# Patient Record
Sex: Female | Born: 1990 | Race: Black or African American | Hispanic: No | Marital: Single | Smoking: Never smoker
Health system: Southern US, Community
[De-identification: ages and names within clinical notes are randomized; demographics above are authoritative.]

## PROBLEM LIST (undated history)

## (undated) ENCOUNTER — Inpatient Hospital Stay (HOSPITAL_COMMUNITY): Payer: Self-pay

## (undated) DIAGNOSIS — R011 Cardiac murmur, unspecified: Secondary | ICD-10-CM

## (undated) DIAGNOSIS — D649 Anemia, unspecified: Secondary | ICD-10-CM

## (undated) DIAGNOSIS — F32A Depression, unspecified: Secondary | ICD-10-CM

## (undated) DIAGNOSIS — F329 Major depressive disorder, single episode, unspecified: Secondary | ICD-10-CM

## (undated) DIAGNOSIS — F419 Anxiety disorder, unspecified: Secondary | ICD-10-CM

## (undated) DIAGNOSIS — R0602 Shortness of breath: Secondary | ICD-10-CM

## (undated) HISTORY — PX: OTHER SURGICAL HISTORY: SHX169

---
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
## 2010-04-19 ENCOUNTER — Emergency Department (HOSPITAL_COMMUNITY)
Admission: EM | Admit: 2010-04-19 | Discharge: 2010-04-19 | Payer: Self-pay | Source: Home / Self Care | Admitting: Emergency Medicine

## 2010-07-28 LAB — URINALYSIS, ROUTINE W REFLEX MICROSCOPIC
Ketones, ur: NEGATIVE mg/dL
Leukocytes, UA: NEGATIVE
Nitrite: NEGATIVE
Protein, ur: 30 mg/dL — AB
Urobilinogen, UA: 0.2 mg/dL (ref 0.0–1.0)

## 2010-07-28 LAB — URINE MICROSCOPIC-ADD ON

## 2010-08-26 LAB — POCT HEMOGLOBIN-HEMACUE: Hemoglobin: 12.5 g/dL (ref 12.0–16.0)

## 2010-09-29 NOTE — Op Note (Signed)
NAME:  Miranda Esparza, Miranda Esparza NO.:  [id number]   MEDICAL RECORD NO.:  [id number]          PATIENT TYPE:  AMB   LOCATION:  DSC                          FACILITY:  [facility]   PHYSICIAN:  Eulas Post, MD    DATE OF BIRTH:  [birth year]   DATE OF PROCEDURE:  09/06/2008  DATE OF DISCHARGE:                               OPERATIVE REPORT   PREOPERATIVE DIAGNOSIS:  Right proximal phalanx fracture of the thumb,  intraarticular.   POSTOPERATIVE DIAGNOSIS:  Right proximal phalanx fracture of the thumb,  intraarticular.   OPERATIVE PROCEDURE:  Open reduction and internal fixation of the right  proximal phalanx articular fracture.   OPERATIVE IMPLANTS:  Synthes 1.3-mm titanium screw x1.   PREOPERATIVE INDICATIONS:  Ms. Miranda Esparza is a 20 year old young  girl who had a right thumb interphalangeal joint fracture dislocation.  She underwent emergent closed reduction last Friday.  She elected to  undergo open reduction and internal fixation of the proximal phalanx  fracture.  This is a planned surgery from the original closed reduction.  The risks, benefits, and alternatives were discussed with her and her  family before the procedure including but not limited to the risks of  infection, bleeding, nerve injury, hardware prominence, hardware  failure, the need for hardware removal, malunion, nonunion, stiffness,  posttraumatic arthritis, cardiopulmonary complications, among others,  and they were willing to proceed.   OPERATIVE PROCEDURE:  The patient was brought to the operating room,  placed in the supine position.  Intravenous Ancef 1 g was given.  General anesthesia was administered.  The right upper extremity was  prepped and draped in the usual sterile fashion.  The arm was elevated  and exsanguinated and the tourniquet was inflated.  Tourniquet pressure  was 250 mmHg for a total of approximately 50 minutes.  An incision was  made curvilinear over the ulnar side of the  interphalangeal joint.  The  fracture was exposed.  Care was taken to protect the collateral  ligaments as well as the palmar digital nerves.  The extensor  retinaculum was incised sharply.  After the fracture was exposed, we  removed the hematoma and reduced the fracture anatomically and held it  provisionally with a clamp.  We then drilled across the condyle and  placed a 1.3-mm screw.  Anatomic alignment was achieved.  Confirmation  was made with C-arm.  Direct visualization of the articular surface  confirmed that anatomic restoration of the joint surface is achieved.  We then irrigated the wounds copiously.  I gave consideration to placing  a second screw, but the first screw did have excellent purchase.  I was  concerned that the condyle may splinter with additional attempts at  fixation and clinically I did not think this was necessary.  I ranged  the thumb under live fluoroscopy and the fracture segments were  extremely stable.  Therefore, we irrigated the wounds copiously.  Rotational stability was restored with the screw, as well as the  fracture in a digitation.  We then closed the extensor retinaculum with  4-0  Vicryl and then closed the skin with 4-0  nylon.  A digital block was  applied.  A thumb spica splint was then applied.  The tourniquet was  released.  The patient tolerated the procedure well.  There were no  complications.  I will plan to initiate somewhat early motion, probably  at approximately 2 weeks.      Eulas Post, MD  Electronically Signed     JPL/MEDQ  D:  09/06/2008  T:  09/06/2008  Job:  [id number]

## 2010-11-29 ENCOUNTER — Emergency Department (HOSPITAL_COMMUNITY)
Admission: EM | Admit: 2010-11-29 | Discharge: 2010-11-29 | Disposition: A | Discharge status: Home / Self Care | Payer: Self-pay | Attending: Emergency Medicine | Admitting: Emergency Medicine

## 2010-11-29 ENCOUNTER — Emergency Department (HOSPITAL_COMMUNITY): Admission: EM | Admit: 2010-11-29 | Payer: Self-pay | Source: Home / Self Care

## 2010-11-29 DIAGNOSIS — M545 Low back pain, unspecified: Secondary | ICD-10-CM | POA: Insufficient documentation

## 2010-11-29 DIAGNOSIS — M549 Dorsalgia, unspecified: Secondary | ICD-10-CM | POA: Insufficient documentation

## 2011-02-05 ENCOUNTER — Emergency Department (HOSPITAL_COMMUNITY): Payer: Self-pay

## 2011-02-05 ENCOUNTER — Inpatient Hospital Stay (HOSPITAL_COMMUNITY)
Admission: EM | Admit: 2011-02-05 | Discharge: 2011-02-07 | DRG: 312 | Disposition: A | Discharge status: Home / Self Care | Payer: Self-pay | Attending: Family Medicine | Admitting: Family Medicine

## 2011-02-05 DIAGNOSIS — R0602 Shortness of breath: Secondary | ICD-10-CM | POA: Diagnosis present

## 2011-02-05 DIAGNOSIS — R079 Chest pain, unspecified: Secondary | ICD-10-CM | POA: Diagnosis present

## 2011-02-05 DIAGNOSIS — E876 Hypokalemia: Secondary | ICD-10-CM | POA: Diagnosis present

## 2011-02-05 DIAGNOSIS — F411 Generalized anxiety disorder: Secondary | ICD-10-CM | POA: Diagnosis present

## 2011-02-05 DIAGNOSIS — I498 Other specified cardiac arrhythmias: Secondary | ICD-10-CM | POA: Diagnosis present

## 2011-02-05 DIAGNOSIS — B9789 Other viral agents as the cause of diseases classified elsewhere: Secondary | ICD-10-CM | POA: Diagnosis present

## 2011-02-05 DIAGNOSIS — R011 Cardiac murmur, unspecified: Secondary | ICD-10-CM | POA: Diagnosis present

## 2011-02-05 DIAGNOSIS — I951 Orthostatic hypotension: Principal | ICD-10-CM | POA: Diagnosis present

## 2011-02-05 LAB — DIFFERENTIAL
Basophils Relative: 0 % (ref 0–1)
Eosinophils Absolute: 0.1 10*3/uL (ref 0.0–0.7)
Eosinophils Relative: 1 % (ref 0–5)
Monocytes Absolute: 0.3 10*3/uL (ref 0.1–1.0)
Monocytes Relative: 4 % (ref 3–12)
Neutrophils Relative %: 74 % (ref 43–77)

## 2011-02-05 LAB — BLOOD GAS, ARTERIAL
Acid-base deficit: 0.3 mmol/L (ref 0.0–2.0)
Acid-base deficit: 0.8 mmol/L (ref 0.0–2.0)
Drawn by: 295031
Drawn by: 340271
FIO2: 0.21 %
O2 Saturation: 97.6 %
TCO2: 20.9 mmol/L (ref 0–100)
pCO2 arterial: 33.2 mmHg — ABNORMAL LOW (ref 35.0–45.0)

## 2011-02-05 LAB — CBC
MCH: 25.9 pg — ABNORMAL LOW (ref 26.0–34.0)
MCHC: 32.5 g/dL (ref 30.0–36.0)
Platelets: 209 10*3/uL (ref 150–400)
RBC: 4.9 MIL/uL (ref 3.87–5.11)
RDW: 13.9 % (ref 11.5–15.5)

## 2011-02-05 LAB — URINALYSIS, ROUTINE W REFLEX MICROSCOPIC
Bilirubin Urine: NEGATIVE
Ketones, ur: NEGATIVE mg/dL
Leukocytes, UA: NEGATIVE
Nitrite: NEGATIVE
Protein, ur: NEGATIVE mg/dL

## 2011-02-05 LAB — BASIC METABOLIC PANEL
Calcium: 9.7 mg/dL (ref 8.4–10.5)
GFR calc Af Amer: >60 mL/min (ref >60)
GFR calc non Af Amer: >60 mL/min (ref >60)
Sodium: 137 mEq/L (ref 135–145)

## 2011-02-05 LAB — POCT PREGNANCY, URINE: Preg Test, Ur: NEGATIVE

## 2011-02-05 MED ORDER — IOHEXOL 300 MG/ML  SOLN
100.0000 mL | Freq: Once | INTRAMUSCULAR | Status: AC | PRN
  Administered 2011-02-05: 60 mL via INTRAVENOUS

## 2011-02-06 LAB — BASIC METABOLIC PANEL
BUN: 4 mg/dL — ABNORMAL LOW (ref 6–23)
CO2: 26 mEq/L (ref 19–32)
Calcium: 8.5 mg/dL (ref 8.4–10.5)
GFR calc non Af Amer: >60 mL/min (ref >60)
Glucose, Bld: 86 mg/dL (ref 70–99)
Potassium: 4.4 mEq/L (ref 3.5–5.1)

## 2011-02-06 LAB — RAPID URINE DRUG SCREEN, HOSP PERFORMED
Barbiturates: NOT DETECTED
Benzodiazepines: NOT DETECTED
Cocaine: NOT DETECTED
Tetrahydrocannabinol: POSITIVE — AB

## 2011-02-06 LAB — TSH: TSH: 2.166 u[IU]/mL (ref 0.350–4.500)

## 2011-02-07 LAB — URINE CULTURE: Culture  Setup Time: 201209220054

## 2011-02-09 NOTE — Discharge Summary (Signed)
  NAMEMarland Kitchen  Miranda, Esparza NO.:  [id number]  MEDICAL RECORD NO.:  [id number]  LOCATION:  1405                         FACILITY:  [facility]  PHYSICIAN:  Brendia Sacks, MD    DATE OF BIRTH:  11-28-1990  DATE OF ADMISSION:  02/05/2011 DATE OF DISCHARGE:  02/07/2011                              DISCHARGE SUMMARY   CONDITION ON DISCHARGE:  Improved.  DISPOSITION:  Home.  DISCHARGE DIAGNOSES: 1. Syncope, likely vasovagal. 2. Mild orthostasis, resolving. 3. Probable viral illness. 4. Hypokalemia, resolved. 5. Murmur, benign.  HISTORY OF PRESENT ILLNESS:  This is a 20 year old woman who presented to the emergency room after having had syncope.  She had been developing a viral illness.  She had 1 previous episode of syncope in the last few months and was attributed to anxiety.  She was found to have a murmur on examination and so was admitted for further observation and 2-D echocardiogram.  HOSPITAL COURSE:  Miranda Esparza was admitted to medical floor, monitored on telemetry.  She had no arrhythmias, no syncope.  Her 2-D echocardiogram is read and shows no abnormalities.  She feels much better now and her orthostasis has improved.  She feels ready to go home.  CONSULTATIONS:  None.  PROCEDURES:  None.  IMAGING: 1. Soft tissue neck secondary to sore throat was normal. 2. Chest x-ray was normal. 3. CT angiogram of chest, no evidence of pulmonary embolism.  MICROBIOLOGY: 1. Urine culture, September 21st:  Mixed bacterial morphotypes     present, non-predominant. 2. 2-D echocardiogram, September 22nd:  Ejection fraction of the left     ventricle 60% to 65%.  Normal wall motion.  No regional wall motion     abnormalities.  PERTINENT LABORATORY STUDIES: 1. Urine pregnancy was negative. 2. Repeat ABG was unremarkable.  First was artifactual. 3. Urine drug screen positive for THC. 4. TSH within normal limits. 5. Recommend cessation of illegal drugs.  DISCHARGE  PHYSICAL EXAMINATION:  GENERAL:  The patient is feeling well. No complaints. CARDIOVASCULAR:  Regular rate and rhythm.  No rub or gallop.  A soft 1/6 murmur, heard only when lying down. RESPIRATORY:  Clear to auscultation bilaterally.  No wheezes, rales, or rhonchi.  Normal respiratory effort.  DISCHARGE INSTRUCTIONS:  ACTIVITIES:  The patient should take time making transitions from sitting to standing, but otherwise activities unrestricted.  DIET:  Unrestricted.  FOLLOWUP:  With [facility] in 2 weeks.  DISCHARGE MEDICATIONS:  None.  TIME COORDINATING DISCHARGE:  15 minutes.     Brendia Sacks, MD     DG/MEDQ  D:  02/07/2011  T:  02/08/2011  Job:  [id number]  Electronically Signed by Brendia Sacks  on 02/09/2011 10:28:23 PM

## 2011-03-02 NOTE — H&P (Signed)
NAME:  Miranda Esparza, Miranda Esparza NO.:  [id number]  MEDICAL RECORD NO.:  [id number]  LOCATION:  WLED                         FACILITY:  [facility]  PHYSICIAN:  Houston Siren, MD           DATE OF BIRTH:  1991-04-03  DATE OF ADMISSION:  02/05/2011 DATE OF DISCHARGE:                             HISTORY & PHYSICAL   PRIMARY CARE PHYSICIAN:  [facility].  ADVANCE DIRECTIVE:  Full code.  REASON FOR ADMISSION:  Syncope, hypokalemia, shortness of breath, and heart murmur.  HISTORY OF PRESENT ILLNESS:  This is a 20 year old female with history of anxiety, otherwise healthy, who had not felt well for the past 2 days and had a syncopal episode at work today.  She stated she felt lightheaded and was having substernal chest pain and shortness of breath.  She denied any leg pain.  She had no fever or chills.  Denied any drug use.  She had no black stool or bloody stools.  Evaluation in the emergency room included a negative urinary pregnancy test.  The ABG showed pH of 7.45, pCO2 of 33, PAO2 of 50, and reportedly she did fall when walking.  EKG showed sinus tachycardia at 120 without any acute ST- T changes.  She had a CT pulmonary angiogram which was negative for pulmonary embolism.  Her chest x-ray is clear.  Her urinalysis is negative.  She also has soft tissue of neck plain film and that was negative as well.  I repeat an arterial blood gas and that was normal and suspect the previous gas was a venous gas.  PAST MEDICAL HISTORY:  Benign.  SOCIAL HISTORY:  She does smoke and drink occasionally.  She is employed.  She denied having any other medical problems.  FAMILY HISTORY:  Not significant.  ALLERGIES:  No known drug allergies.  MEDICATIONS:  On no chronic meds.  PHYSICAL EXAMINATION:  VITAL SIGNS:  Shows blood pressure is 101/50, heart rate of 120, respiratory rate of 15, temperature 99.4. GENERAL:  She is alert and oriented.  She is in no apparent distress. HEENT:   Her pupils are small, but reactive.  Throat is clear.  Sclerae nonicteric. NECK:  Supple.  No carotid bruits.  No stridor. LUNGS:  Clear.  No wheezes, rales, or any evidence of consolidation. HEART EXAM:  Revealed as tachycardic.  There is a murmur, very faint, only 1 to 2 over 6; however, it is louder with inspiration and Valsalva. ABDOMINAL EXAM:  Soft, nondistended, nontender. EXTREMITIES:  Show no edema.  No calf tenderness. SKIN:  Warm and dry. NEUROLOGIC:  She appears to be tired, but otherwise unremarkable.  LABORATORY STUDIES:  As above.  IMPRESSION:  This is a 20 year old with syncope, chest pain, hypokalemia, and an abnormal heart murmur that is soft.  I suspect that she just had a vasovagal syncope and had a panic attack with hypoventilation.  However, given that she has multiple conditions and symptoms with an abnormal heart murmur, we will admit her for observation.  She will need an echo.  I will also replete the potassium and check a magnesium.  She is otherwise very stable, and I had thought about discharging  her home, but had decided against it.  We will admit her to [facility].     Houston Siren, MD     PL/MEDQ  D:  02/05/2011  T:  02/05/2011  Job:  [id number]  Electronically Signed by Houston Siren  on 03/02/2011 03:52:47 AM

## 2012-11-26 ENCOUNTER — Inpatient Hospital Stay (HOSPITAL_COMMUNITY): Payer: Self-pay

## 2012-11-26 ENCOUNTER — Encounter (HOSPITAL_COMMUNITY): Payer: Self-pay | Admitting: Family

## 2012-11-26 ENCOUNTER — Inpatient Hospital Stay (HOSPITAL_COMMUNITY)
Admission: AD | Admit: 2012-11-26 | Discharge: 2012-11-26 | Disposition: A | Discharge status: Home / Self Care | Payer: Self-pay | Source: Ambulatory Visit | Attending: Obstetrics & Gynecology | Admitting: Obstetrics & Gynecology

## 2012-11-26 DIAGNOSIS — R109 Unspecified abdominal pain: Secondary | ICD-10-CM | POA: Insufficient documentation

## 2012-11-26 DIAGNOSIS — O26899 Other specified pregnancy related conditions, unspecified trimester: Secondary | ICD-10-CM

## 2012-11-26 DIAGNOSIS — Z3201 Encounter for pregnancy test, result positive: Secondary | ICD-10-CM

## 2012-11-26 DIAGNOSIS — O99891 Other specified diseases and conditions complicating pregnancy: Secondary | ICD-10-CM | POA: Insufficient documentation

## 2012-11-26 HISTORY — DX: Major depressive disorder, single episode, unspecified: F32.9

## 2012-11-26 HISTORY — DX: Anxiety disorder, unspecified: F41.9

## 2012-11-26 HISTORY — DX: Shortness of breath: R06.02

## 2012-11-26 HISTORY — DX: Depression, unspecified: F32.A

## 2012-11-26 LAB — CBC
MCH: 26.6 pg (ref 26.0–34.0)
MCV: 78.2 fL (ref 78.0–100.0)
Platelets: 210 10*3/uL (ref 150–400)
RBC: 4.59 MIL/uL (ref 3.87–5.11)

## 2012-11-26 LAB — URINALYSIS, ROUTINE W REFLEX MICROSCOPIC
Glucose, UA: NEGATIVE mg/dL
Hgb urine dipstick: NEGATIVE
Specific Gravity, Urine: 1.02 (ref 1.005–1.030)
Urobilinogen, UA: 0.2 mg/dL (ref 0.0–1.0)
pH: 6.5 (ref 5.0–8.0)

## 2012-11-26 LAB — WET PREP, GENITAL: Yeast Wet Prep HPF POC: NONE SEEN

## 2012-11-26 LAB — POCT PREGNANCY, URINE: Preg Test, Ur: POSITIVE — AB

## 2012-11-26 MED ORDER — PROMETHAZINE HCL 25 MG PO TABS: 25.0000 mg | ORAL_TABLET | Freq: Four times a day (QID) | ORAL | Status: DC | PRN

## 2012-11-26 MED ORDER — ONDANSETRON HCL 4 MG PO TABS: 4.0000 mg | ORAL_TABLET | Freq: Four times a day (QID) | ORAL | Status: DC

## 2012-11-26 NOTE — MAU Note (Signed)
Pt reports lower abd cramping x 2 days. LMP 10/29/2012, positive home preg test yesterday.

## 2012-11-26 NOTE — MAU Provider Note (Signed)
History     CSN: [id number]  Arrival date and time: 11/26/12 6440   First Provider Initiated Contact with Patient 11/26/12 2020      Chief Complaint  Patient presents with  . Possible Pregnancy  . Abdominal Pain   HPI Ms. Miranda Esparza is a 22 y.o. G1P0 at [redacted]w[redacted]d who presents to MAU today with complaint of lower abdominal cramping off and on x 2 days. The patient states +HPT yesterday. She has also had occasional N/V. She has a small amount of thin, white discharge without odor. She has no pain now, but states that pain was 7/10 upon arrival in MAU. The patient denies fever, vaginal bleeding, dizziness or UTI symptoms.    OB History   Grav Para Term Preterm Abortions TAB SAB Ect Mult Living   1               Past Medical History  Diagnosis Date  . Shortness of breath     since having bronchitis May 2014  . Anxiety   . Depression     Past Surgical History  Procedure Laterality Date  . Thumb surgery  2010    History reviewed. No pertinent family history.  History  Substance Use Topics  . Smoking status: Never Smoker   . Smokeless tobacco: Never Used  . Alcohol Use: Yes    Allergies: No Known Allergies  No prescriptions prior to admission    Review of Systems  Constitutional: Negative for fever and malaise/fatigue.  Gastrointestinal: Positive for nausea, vomiting and abdominal pain. Negative for diarrhea and constipation.  Genitourinary: Negative for dysuria, urgency and frequency.       Neg - vaginal bleeding + vaginal discharge  Neurological: Negative for dizziness, loss of consciousness and weakness.   Physical Exam   Blood pressure 117/70, pulse 79, temperature 98.1 F (36.7 C), temperature source Oral, resp. rate 16, height 5\' 1"  (1.549 m), weight 100 lb (45.36 kg), last menstrual period 10/29/2012, SpO2 100.00%.  Physical Exam  Constitutional: She is oriented to person, place, and time. She appears well-developed and well-nourished. No distress.   HENT:  Head: Normocephalic and atraumatic.  Cardiovascular: Normal rate, regular rhythm and normal heart sounds.   Respiratory: Effort normal and breath sounds normal. No respiratory distress.  GI: Soft. Bowel sounds are normal. She exhibits no distension and no mass. There is no tenderness. There is no rebound and no guarding.  Genitourinary: Uterus is not enlarged and not tender. Cervix exhibits no motion tenderness, no discharge and no friability. Right adnexum displays no mass and no tenderness. Left adnexum displays no mass and no tenderness. No bleeding around the vagina. Vaginal discharge (scant thin, white discharge noted) found.  Neurological: She is alert and oriented to person, place, and time.  Skin: Skin is warm and dry. No erythema.  Psychiatric: She has a normal mood and affect.   Results for orders placed during the hospital encounter of 11/26/12 (from the past 24 hour(s))  URINALYSIS, ROUTINE W REFLEX MICROSCOPIC     Status: None   Collection Time    11/26/12  7:45 PM      Result Value Range   Color, Urine YELLOW  YELLOW   APPearance CLEAR  CLEAR   Specific Gravity, Urine 1.020  1.005 - 1.030   pH 6.5  5.0 - 8.0   Glucose, UA NEGATIVE  NEGATIVE mg/dL   Hgb urine dipstick NEGATIVE  NEGATIVE   Bilirubin Urine NEGATIVE  NEGATIVE   Ketones, ur  NEGATIVE  NEGATIVE mg/dL   Protein, ur NEGATIVE  NEGATIVE mg/dL   Urobilinogen, UA 0.2  0.0 - 1.0 mg/dL   Nitrite NEGATIVE  NEGATIVE   Leukocytes, UA NEGATIVE  NEGATIVE  POCT PREGNANCY, URINE     Status: Abnormal   Collection Time    11/26/12  7:57 PM      Result Value Range   Preg Test, Ur POSITIVE (*) NEGATIVE  WET PREP, GENITAL     Status: Abnormal   Collection Time    11/26/12  8:30 PM      Result Value Range   Yeast Wet Prep HPF POC NONE SEEN  NONE SEEN   Trich, Wet Prep NONE SEEN  NONE SEEN   Clue Cells Wet Prep HPF POC NONE SEEN  NONE SEEN   WBC, Wet Prep HPF POC FEW (*) NONE SEEN  CBC     Status: Abnormal    Collection Time    11/26/12  8:40 PM      Result Value Range   WBC 3.7 (*) 4.0 - 10.5 K/uL   RBC 4.59  3.87 - 5.11 MIL/uL   Hemoglobin 12.2  12.0 - 15.0 g/dL   HCT 16.1 (*) 09.6 - 04.5 %   MCV 78.2  78.0 - 100.0 fL   MCH 26.6  26.0 - 34.0 pg   MCHC 34.0  30.0 - 36.0 g/dL   RDW 40.9  81.1 - 91.4 %   Platelets 210  150 - 400 K/uL  ABO/RH     Status: None   Collection Time    11/26/12  8:40 PM      Result Value Range   ABO/RH(D) B POS    HCG, QUANTITATIVE, PREGNANCY     Status: Abnormal   Collection Time    11/26/12  8:40 PM      Result Value Range   hCG, Beta Chain, Quant, S 33 (*) <5 mIU/mL   US Ob Comp Less 14 Wks  11/26/2012   *RADIOLOGY REPORT*  Clinical Data: Lower abdominal and pelvic pain.  OBSTETRIC <14 WK [place] AND TRANSVAGINAL OB US  Technique:  Both transabdominal and transvaginal ultrasound examinations were performed for complete evaluation of the gestation as well as the maternal uterus, adnexal regions, and pelvic cul-de-sac.  Transvaginal technique was performed to assess early pregnancy.  Comparison:  None.  Intrauterine gestational sac:  None seen. Yolk sac: N/A Embryo: N/A  Maternal uterus/adnexae: The uterus is unremarkable in appearance; there is suggestion of an arcuate uterus.  No subchorionic hemorrhage is noted.  The ovaries are within normal limits.  The right ovary measures 3.4 x 1.5 x 1.9 cm, while the left ovary measures 3.1 x 1.4 x 1.8 cm. No suspicious adnexal masses are seen; there is no evidence for ovarian torsion.  No free fluid is seen within the pelvic cul-de-sac.  IMPRESSION: No intrauterine gestational sac seen at this time; no evidence for ectopic pregnancy.  No free fluid seen in the pelvic cul-de-sac. It may be too early to characterize an intrauterine pregnancy. Would correlate with the patient's pending quantitative beta HCG level.   Original Report Authenticated By: Tonia Ghent, M.D.   US Ob Transvaginal  11/26/2012   *RADIOLOGY REPORT*  Clinical  Data: Lower abdominal and pelvic pain.  OBSTETRIC <14 WK [place] AND TRANSVAGINAL OB US  Technique:  Both transabdominal and transvaginal ultrasound examinations were performed for complete evaluation of the gestation as well as the maternal uterus, adnexal regions, and pelvic cul-de-sac.  Transvaginal  technique was performed to assess early pregnancy.  Comparison:  None.  Intrauterine gestational sac:  None seen. Yolk sac: N/A Embryo: N/A  Maternal uterus/adnexae: The uterus is unremarkable in appearance; there is suggestion of an arcuate uterus.  No subchorionic hemorrhage is noted.  The ovaries are within normal limits.  The right ovary measures 3.4 x 1.5 x 1.9 cm, while the left ovary measures 3.1 x 1.4 x 1.8 cm. No suspicious adnexal masses are seen; there is no evidence for ovarian torsion.  No free fluid is seen within the pelvic cul-de-sac.  IMPRESSION: No intrauterine gestational sac seen at this time; no evidence for ectopic pregnancy.  No free fluid seen in the pelvic cul-de-sac. It may be too early to characterize an intrauterine pregnancy. Would correlate with the patient's pending quantitative beta HCG level.   Original Report Authenticated By: Tonia Ghent, M.D.    MAU Course  Procedures None  MDM +UPT UA, Wet prep, GC/Chlamydia, CBC, ABO/Rh, quant hCG and [place] today No IUGS visualized on [place] today - not expected with GA and quant hCG of 33. Will have patient follow-up for serial hCGs  Assessment and Plan  A: Positive pregnancy test Abdominal pain in pregnancy  P: Discharge home Rx for Phenergan and Zofran sent to patient's pharmacy Pregnancy confirmation letter given First trimester warning signs reviewed Patient to return to MAU in 48hours for follow up quant hCG Patient may return to MAU sooner as needed or if her condition were to change or worsen  Freddi Starr, PA-C  11/26/2012, 10:58 PM

## 2012-11-26 NOTE — MAU Note (Signed)
Patient presents to MAU with c/o lower abdominal pain x 2 days; reports +HPT yesterday. Denies vaginal bleeding.

## 2012-11-28 ENCOUNTER — Encounter (HOSPITAL_COMMUNITY): Payer: Self-pay

## 2012-11-28 ENCOUNTER — Inpatient Hospital Stay (HOSPITAL_COMMUNITY)
Admission: AD | Admit: 2012-11-28 | Discharge: 2012-11-28 | Disposition: A | Discharge status: Home / Self Care | Payer: Self-pay | Source: Ambulatory Visit | Attending: Family Medicine | Admitting: Family Medicine

## 2012-11-28 DIAGNOSIS — R109 Unspecified abdominal pain: Secondary | ICD-10-CM | POA: Insufficient documentation

## 2012-11-28 DIAGNOSIS — O4690 Antepartum hemorrhage, unspecified, unspecified trimester: Secondary | ICD-10-CM

## 2012-11-28 DIAGNOSIS — O209 Hemorrhage in early pregnancy, unspecified: Secondary | ICD-10-CM | POA: Insufficient documentation

## 2012-11-28 MED ORDER — HYDROMORPHONE HCL PF 1 MG/ML IJ SOLN
1.0000 mg | INTRAMUSCULAR | Status: AC
  Administered 2012-11-28: 1 mg via INTRAMUSCULAR
  Filled 2012-11-28: qty 1

## 2012-11-28 NOTE — MAU Note (Signed)
Patient to MAU for follow up BHCG. Patient states she started bleeding like a period last night and having abdominal cramping.

## 2012-11-28 NOTE — MAU Provider Note (Signed)
  History     CSN: [id number]  Arrival date and time: 11/28/12 1215   First Provider Initiated Contact with Patient 11/28/12 1345      Chief Complaint  Patient presents with  . Follow-up   HPI Pt is G1P0 [redacted]w[redacted]d pregnant and presents with abdominal cramping which has come and gone but is worse today.  Pt was seen 2 days ago with HCG of 43 and normal ultrasound with normal uterus and ovaries.  Pt is here for f/u  Peace Thompson Caul, RN Registered Nurse Signed  MAU Note Service date: 11/28/2012 12:37 PM   Patient is in for a f/u bhcg but is complaining of painful abdominal cramping. Patient is asking for pain medicine.      Past Medical History  Diagnosis Date  . Shortness of breath     since having bronchitis May 2014  . Anxiety   . Depression     Past Surgical History  Procedure Laterality Date  . Thumb surgery  2010    History reviewed. No pertinent family history.  History  Substance Use Topics  . Smoking status: Never Smoker   . Smokeless tobacco: Never Used  . Alcohol Use: Yes    Allergies: No Known Allergies  No prescriptions prior to admission    Review of Systems  Gastrointestinal: Positive for nausea and abdominal pain. Negative for diarrhea and constipation.  Genitourinary: Negative for dysuria and urgency.   Physical Exam   Blood pressure 125/76, pulse 81, temperature 98.5 F (36.9 C), temperature source Oral, resp. rate 18, last menstrual period 10/29/2012, SpO2 100.00%.  Physical Exam  Vitals reviewed. Constitutional: She is oriented to person, place, and time. She appears well-developed and well-nourished.  HENT:  Head: Normocephalic.  Eyes: Pupils are equal, round, and reactive to light.  Neck: Normal range of motion. Neck supple.  Cardiovascular: Normal rate.   Respiratory: Effort normal.  GI: Soft. She exhibits no distension. There is no tenderness. There is no rebound and no guarding.  Genitourinary:  Small amount of dark red blood  from os; cervix closed; bimanual uterus NSSC mild right adnexal tenderness with palpation, no rebound; left adnexa without palpable enlargement or tenderness  Musculoskeletal: Normal range of motion.  Neurological: She is alert and oriented to person, place, and time.  Skin: Skin is warm and dry.    MAU Course  Procedures Dilaudid 1 mg IM given for pain with relief  Results for orders placed during the hospital encounter of 11/28/12 (from the past 24 hour(s))  HCG, QUANTITATIVE, PREGNANCY     Status: Abnormal   Collection Time    11/28/12 12:20 PM      Result Value Range   hCG, Beta Chain, Quant, S 43 (*) <5 mIU/mL     Assessment and Plan  Bleeding in early pregnancy Inappropriate rise in HCG from 33 to 43 in 48 hrs F/u in 48 hours for repeat HCG  Ectopic precautions discussed with pt and partner- pt to go to nearest hospital if increase in pain - ectopic risk discussed and could not be ruled out at this time  [facility] 11/28/2012, 5:17 PM

## 2012-11-28 NOTE — MAU Provider Note (Signed)
Chart reviewed and agree with management and plan.  

## 2012-11-28 NOTE — MAU Note (Signed)
Patient is in for a f/u bhcg but is complaining of painful abdominal cramping. Patient is asking for pain medicine.

## 2012-12-01 ENCOUNTER — Inpatient Hospital Stay (HOSPITAL_COMMUNITY)
Admission: AD | Admit: 2012-12-01 | Discharge: 2012-12-01 | Disposition: A | Discharge status: Home / Self Care | Payer: Self-pay | Source: Ambulatory Visit | Attending: Obstetrics & Gynecology | Admitting: Obstetrics & Gynecology

## 2012-12-01 DIAGNOSIS — O039 Complete or unspecified spontaneous abortion without complication: Secondary | ICD-10-CM | POA: Insufficient documentation

## 2012-12-01 LAB — HCG, QUANTITATIVE, PREGNANCY: hCG, Beta Chain, Quant, S: 6 m[IU]/mL — ABNORMAL HIGH (ref <5)

## 2012-12-01 NOTE — MAU Note (Signed)
Was here Tues and has returned for repeat BHCG. States " I'm still on my period but I was bleeding when I was here Tuesday". Only change since Tues is that cramping has stopped.

## 2012-12-01 NOTE — MAU Provider Note (Signed)
  History     CSN: [id number]  Arrival date and time: 12/01/12 1847   None     No chief complaint on file.  HPI Miranda Esparza is 22 y.o. G1P0 [redacted]w[redacted]d weeks presenting for repeat BHCG>  She was initially here 7/13--BHCG 33 and U/S wnl.  She returned on 7/15 BHCG 43.     Past Medical History  Diagnosis Date  . Shortness of breath     since having bronchitis May 2014  . Anxiety   . Depression     Past Surgical History  Procedure Laterality Date  . Thumb surgery  2010    No family history on file.  History  Substance Use Topics  . Smoking status: Never Smoker   . Smokeless tobacco: Never Used  . Alcohol Use: Yes    Allergies: No Known Allergies  Prescriptions prior to admission  Medication Sig Dispense Refill  . albuterol (PROVENTIL HFA;VENTOLIN HFA) 108 (90 BASE) MCG/ACT inhaler Inhale 2 puffs into the lungs every 6 (six) hours as needed for shortness of breath.        ROS Physical Exam   Last menstrual period 10/29/2012.  Physical Exam  MAU Course  Procedures  MDM 20:00  Care turned over to Thressa Sheller, CNM Results for orders placed during the hospital encounter of 12/01/12 (from the past 24 hour(s))  HCG, QUANTITATIVE, PREGNANCY     Status: Abnormal   Collection Time    12/01/12  7:20 PM      Result Value Range   hCG, Beta Chain, Quant, S 6 (*) <5 mIU/mL     Assessment and Plan   1. SAB (spontaneous abortion)    Bleeding precautions and danger signs reviewed FU in Clinic   KEY,EVE M 12/01/2012, 7:21 PM

## 2012-12-01 NOTE — MAU Note (Signed)
Pt in Triage with Thressa Sheller CNM discussing lab result and plan of care.

## 2013-01-19 ENCOUNTER — Encounter (HOSPITAL_COMMUNITY): Payer: Self-pay | Admitting: Emergency Medicine

## 2013-01-19 ENCOUNTER — Emergency Department (HOSPITAL_COMMUNITY)
Admission: EM | Admit: 2013-01-19 | Discharge: 2013-01-19 | Disposition: A | Discharge status: Home / Self Care | Payer: Self-pay | Attending: Emergency Medicine | Admitting: Emergency Medicine

## 2013-01-19 DIAGNOSIS — Z8659 Personal history of other mental and behavioral disorders: Secondary | ICD-10-CM | POA: Insufficient documentation

## 2013-01-19 DIAGNOSIS — F411 Generalized anxiety disorder: Secondary | ICD-10-CM | POA: Insufficient documentation

## 2013-01-19 DIAGNOSIS — Z91040 Latex allergy status: Secondary | ICD-10-CM | POA: Insufficient documentation

## 2013-01-19 DIAGNOSIS — R070 Pain in throat: Secondary | ICD-10-CM | POA: Insufficient documentation

## 2013-01-19 DIAGNOSIS — Y9289 Other specified places as the place of occurrence of the external cause: Secondary | ICD-10-CM | POA: Insufficient documentation

## 2013-01-19 DIAGNOSIS — Y9389 Activity, other specified: Secondary | ICD-10-CM | POA: Insufficient documentation

## 2013-01-19 DIAGNOSIS — R000 Tachycardia, unspecified: Secondary | ICD-10-CM | POA: Insufficient documentation

## 2013-01-19 DIAGNOSIS — T628X1A Toxic effect of other specified noxious substances eaten as food, accidental (unintentional), initial encounter: Secondary | ICD-10-CM | POA: Insufficient documentation

## 2013-01-19 DIAGNOSIS — Z8709 Personal history of other diseases of the respiratory system: Secondary | ICD-10-CM | POA: Insufficient documentation

## 2013-01-19 MED ORDER — IBUPROFEN 200 MG PO TABS
400.0000 mg | ORAL_TABLET | Freq: Once | ORAL | Status: AC
  Administered 2013-01-19: 400 mg via ORAL
  Filled 2013-01-19: qty 2

## 2013-01-19 MED ORDER — LIDOCAINE VISCOUS 2 % MT SOLN
15.0000 mL | Freq: Once | OROMUCOSAL | Status: AC
  Administered 2013-01-19: 15 mL via OROMUCOSAL
  Filled 2013-01-19: qty 15

## 2013-01-19 NOTE — ED Notes (Signed)
Pt states after eating boneless wings at [facility] jacks ago her throat feels "scratchy", painful and difficult to swallow, no hives noted. Pt tearful, speaking clearly in full sentences.

## 2013-01-27 NOTE — ED Provider Notes (Signed)
CSN: [id number]     Arrival date & time 01/19/13  2156 History   First MD Initiated Contact with Patient 01/19/13 2209     Chief Complaint  Patient presents with  . Allergic Reaction   (Consider location/radiation/quality/duration/timing/severity/associated sxs/prior Treatment) HPI  22 year old female with a sensation that her throat is closing and that feel scratchy. Onset just before arrival while eating this chicken wings. She had a pain in her throat when swallowing. Since then she's had persistent pain which is still worse every time she swallows. She does not feel short of breath. No wheezing. No abnormal pain, nausea, vomiting or diarrhea. No history similar symptoms. No rash. No intervention prior to arrival.  Past Medical History  Diagnosis Date  . Shortness of breath     since having bronchitis May 2014  . Anxiety   . Depression    Past Surgical History  Procedure Laterality Date  . Thumb surgery  2010   No family history on file. History  Substance Use Topics  . Smoking status: Never Smoker   . Smokeless tobacco: Never Used  . Alcohol Use: Yes     Comment: occasional   OB History   Grav Para Term Preterm Abortions TAB SAB Ect Mult Living   1              Review of Systems  All systems reviewed and negative, other than as noted in HPI.   Allergies  Latex  Home Medications   Current Outpatient Rx  Name  Route  Sig  Dispense  Refill  . Pseudoeph-Doxylamine-DM-APAP (NYQUIL PO)   Oral   Take 1 capsule by mouth at bedtime as needed (congestion).          BP 132/84  Pulse 111  Temp(Src) 98.5 F (36.9 C) (Oral)  Resp 18  Ht 5\' 1"  (1.549 m)  Wt 103 lb (46.72 kg)  BMI 19.47 kg/m2  SpO2 100%  LMP 12/15/2012  Breastfeeding? Unknown Physical Exam  Nursing note and vitals reviewed. Constitutional: She appears well-developed and well-nourished. No distress.  HENT:  Head: Normocephalic and atraumatic.  Speaking in complete sentences. Normal sounding  phonation. Uvula is midline. Handling secretions. No stridor. Neck is supple.  Eyes: Conjunctivae are normal. Right eye exhibits no discharge. Left eye exhibits no discharge.  Neck: Neck supple.  Cardiovascular: Regular rhythm and normal heart sounds.  Exam reveals no gallop and no friction rub.   No murmur heard. Mild tachycardia  Pulmonary/Chest: Effort normal and breath sounds normal. No respiratory distress.  Lungs clear bilaterally with good air movement.  Abdominal: Soft. She exhibits no distension. There is no tenderness.  Musculoskeletal: She exhibits no edema and no tenderness.  Neurological: She is alert.  anxious. Tearful at times.  Skin: Skin is warm and dry.  Psychiatric: She has a normal mood and affect. Her behavior is normal. Thought content normal.    ED Course  Procedures (including critical care time) Labs Review Labs Reviewed - No data to display Imaging Review No results found.  MDM   1. Throat pain    22 year old female with throat discomfort. Suspect esophageal irritation. I feel an allergic reaction is much less likely. Is no evidence of hemodynamic compromise. No respiratory distress. Patient was observed emergency room with improvement of symptoms. Appears stable for discharge at this time.    Raeford Razor, MD 01/27/13 [phone]

## 2013-03-06 ENCOUNTER — Inpatient Hospital Stay (HOSPITAL_COMMUNITY)
Admission: AD | Admit: 2013-03-06 | Discharge: 2013-03-06 | Disposition: A | Discharge status: Home / Self Care | Payer: Self-pay | Source: Ambulatory Visit | Attending: Obstetrics & Gynecology | Admitting: Obstetrics & Gynecology

## 2013-03-06 ENCOUNTER — Encounter (HOSPITAL_COMMUNITY): Payer: Self-pay | Admitting: *Deleted

## 2013-03-06 DIAGNOSIS — Z3201 Encounter for pregnancy test, result positive: Secondary | ICD-10-CM | POA: Insufficient documentation

## 2013-03-06 MED ORDER — PROMETHAZINE HCL 25 MG PO TABS: 25.0000 mg | ORAL_TABLET | Freq: Four times a day (QID) | ORAL | Status: DC | PRN

## 2013-03-06 NOTE — MAU Note (Signed)
+  HPT 2 days ago.  Had miscarriage in July, wants to make sure everything is ok.  Denies pain or bleeding.

## 2013-03-06 NOTE — MAU Provider Note (Signed)
  History     CSN: [id number]  Arrival date and time: 03/06/13 1548   None     Chief Complaint  Patient presents with  . possible pregnant    HPI This is a 22 y.o. female at 6.[redacted] weeks pregnant by LMP of 01/23/13 who presents "to see if everything is OK".  Had a miscarriage in July. Denies pain or bleeding.   RN Note:  +HPT 2 days ago. Had miscarriage in July, wants to make sure everything is ok. Denies pain or bleeding.       OB History   Grav Para Term Preterm Abortions TAB SAB Ect Mult Living   3    1  1    0      Past Medical History  Diagnosis Date  . Shortness of breath     since having bronchitis May 2014  . Anxiety   . Depression     Past Surgical History  Procedure Laterality Date  . Thumb surgery  2010    No family history on file.  History  Substance Use Topics  . Smoking status: Never Smoker   . Smokeless tobacco: Never Used  . Alcohol Use: Yes     Comment: occasional    Allergies:  Allergies  Allergen Reactions  . Latex Other (See Comments)    Latex condoms cause yeast infection.    Prescriptions prior to admission  Medication Sig Dispense Refill  . Pseudoeph-Doxylamine-DM-APAP (NYQUIL PO) Take 1 capsule by mouth at bedtime as needed (congestion).        Review of Systems  Constitutional: Negative for fever and malaise/fatigue.  Gastrointestinal: Positive for nausea (occasional mild). Negative for vomiting, abdominal pain, diarrhea and constipation.  Genitourinary: Negative for dysuria.       No bleeding   Neurological: Negative for dizziness.   Physical Exam   Blood pressure 106/44, pulse 87, temperature 98.9 F (37.2 C), temperature source Oral, resp. rate 16, height 5\' 1"  (1.549 m), weight 45.723 kg (100 lb 12.8 oz), last menstrual period 01/23/2013.  Physical Exam  Constitutional: She is oriented to person, place, and time. She appears well-developed and well-nourished. No distress.  Cardiovascular: Normal rate.    Respiratory: Effort normal.  GI: Soft. There is no tenderness.  Neurological: She is alert and oriented to person, place, and time.  Skin: Skin is warm and dry.  Psychiatric: She has a normal mood and affect.    MAU Course  Procedures  MDM Results for orders placed during the hospital encounter of 03/06/13 (from the past 24 hour(s))  POCT PREGNANCY, URINE     Status: Abnormal   Collection Time    03/06/13  4:13 PM      Result Value Range   Preg Test, Ur POSITIVE (*) NEGATIVE     Assessment and Plan  A:  Positive pregnancy test at 6.0 weeks by LMP      No complaints      SAB in 7/14  P:  Discharge home       Discussed prenatal care       Proof of pregnancy letter given       Phenergan Rx given  [facility] 03/06/2013, 4:44 PM

## 2013-03-06 NOTE — MAU Provider Note (Signed)
Attestation of Attending Supervision of Advanced Practitioner (CNM/NP): Evaluation and management procedures were performed by the Advanced Practitioner under my supervision and collaboration.  I have reviewed the Advanced Practitioner's note and chart, and I agree with the management and plan.  HARRAWAY-SMITH, Wendall Isabell 7:16 PM

## 2013-03-21 ENCOUNTER — Inpatient Hospital Stay (HOSPITAL_COMMUNITY): Payer: Medicaid Other

## 2013-03-21 ENCOUNTER — Inpatient Hospital Stay (HOSPITAL_COMMUNITY)
Admission: AD | Admit: 2013-03-21 | Discharge: 2013-03-21 | Disposition: A | Discharge status: Home / Self Care | Payer: Medicaid Other | Source: Ambulatory Visit | Attending: Obstetrics & Gynecology | Admitting: Obstetrics & Gynecology

## 2013-03-21 ENCOUNTER — Encounter (HOSPITAL_COMMUNITY): Payer: Self-pay | Admitting: *Deleted

## 2013-03-21 DIAGNOSIS — N760 Acute vaginitis: Secondary | ICD-10-CM | POA: Insufficient documentation

## 2013-03-21 DIAGNOSIS — Z3490 Encounter for supervision of normal pregnancy, unspecified, unspecified trimester: Secondary | ICD-10-CM

## 2013-03-21 DIAGNOSIS — A499 Bacterial infection, unspecified: Secondary | ICD-10-CM | POA: Insufficient documentation

## 2013-03-21 DIAGNOSIS — O2390 Unspecified genitourinary tract infection in pregnancy, unspecified trimester: Secondary | ICD-10-CM | POA: Insufficient documentation

## 2013-03-21 DIAGNOSIS — B9689 Other specified bacterial agents as the cause of diseases classified elsewhere: Secondary | ICD-10-CM | POA: Insufficient documentation

## 2013-03-21 DIAGNOSIS — Z1389 Encounter for screening for other disorder: Secondary | ICD-10-CM

## 2013-03-21 DIAGNOSIS — R109 Unspecified abdominal pain: Secondary | ICD-10-CM | POA: Insufficient documentation

## 2013-03-21 DIAGNOSIS — O26899 Other specified pregnancy related conditions, unspecified trimester: Secondary | ICD-10-CM

## 2013-03-21 LAB — CBC
HCT: 33.9 % — ABNORMAL LOW (ref 36.0–46.0)
MCH: 25.4 pg — ABNORMAL LOW (ref 26.0–34.0)
MCV: 75.5 fL — ABNORMAL LOW (ref 78.0–100.0)
Platelets: 232 10*3/uL (ref 150–400)
RBC: 4.49 MIL/uL (ref 3.87–5.11)
RDW: 13.8 % (ref 11.5–15.5)

## 2013-03-21 LAB — URINALYSIS, ROUTINE W REFLEX MICROSCOPIC
Bilirubin Urine: NEGATIVE
Glucose, UA: NEGATIVE mg/dL
Hgb urine dipstick: NEGATIVE
Ketones, ur: NEGATIVE mg/dL
Nitrite: NEGATIVE
Specific Gravity, Urine: <1.005 — ABNORMAL LOW (ref 1.005–1.030)
Urobilinogen, UA: 0.2 mg/dL (ref 0.0–1.0)

## 2013-03-21 LAB — WET PREP, GENITAL: Trich, Wet Prep: NONE SEEN

## 2013-03-21 MED ORDER — METRONIDAZOLE 500 MG PO TABS: 500.0000 mg | ORAL_TABLET | Freq: Two times a day (BID) | ORAL | Status: DC

## 2013-03-21 MED ORDER — METRONIDAZOLE 0.75 % VA GEL: 1.0000 | Freq: Every day | VAGINAL | Status: DC

## 2013-03-21 NOTE — MAU Provider Note (Signed)

## 2013-03-21 NOTE — MAU Provider Note (Signed)
Chief Complaint: Abdominal Pain   First Provider Initiated Contact with Patient 03/21/13 1311     SUBJECTIVE HPI: Miranda Esparza is a 22 y.o. G2P0010 at [redacted]w[redacted]d by LMP who presents to maternity admissions reporting severe abdominal cramping waking her up from sleep this morning and progressively worsening over the day.  She had a miscarriage in July and is concerned about this.  She denies vaginal bleeding, vaginal itching/burning, urinary symptoms, h/a, dizziness, n/v, or fever/chills.    Past Medical History  Diagnosis Date  . Shortness of breath     since having bronchitis May 2014  . Anxiety   . Depression    Past Surgical History  Procedure Laterality Date  . Thumb surgery  2010   History   Social History  . Marital Status: Single    Spouse Name: N/A    Number of Children: N/A  . Years of Education: N/A   Occupational History  . Not on file.   Social History Main Topics  . Smoking status: Never Smoker   . Smokeless tobacco: Never Used  . Alcohol Use: Yes     Comment: occasional  . Drug Use: No  . Sexual Activity: Yes    Birth Control/ Protection: None   Other Topics Concern  . Not on file   Social History Narrative  . No narrative on file   No current facility-administered medications on file prior to encounter.   Current Outpatient Prescriptions on File Prior to Encounter  Medication Sig Dispense Refill  . promethazine (PHENERGAN) 25 MG tablet Take 1 tablet (25 mg total) by mouth every 6 (six) hours as needed for nausea.  30 tablet  0   Allergies  Allergen Reactions  . Latex Other (See Comments)    Latex condoms cause yeast infection.    ROS: Pertinent items in HPI  OBJECTIVE Blood pressure 114/59, pulse 89, temperature 99.1 F (37.3 C), temperature source Oral, resp. rate 20, height 5\' 1"  (1.549 m), weight 46.72 kg (103 lb), last menstrual period 01/23/2013. GENERAL: Well-developed, well-nourished female in no acute distress.  HEENT:  Normocephalic HEART: normal rate RESP: normal effort ABDOMEN: Soft, non-tender EXTREMITIES: Nontender, no edema NEURO: Alert and oriented Pelvic exam: Cervix pink, visually closed, without lesion, moderate amount white/yellow discharge, vaginal walls and external genitalia normal Bimanual exam: Cervix 0/long/high, firm, anterior, neg CMT, uterus nontender, slightly enlarged, adnexa without tenderness, enlargement, or mass  LAB RESULTS Results for orders placed during the hospital encounter of 03/21/13 (from the past 24 hour(s))  URINALYSIS, ROUTINE W REFLEX MICROSCOPIC     Status: Abnormal   Collection Time    03/21/13 11:00 AM      Result Value Range   Color, Urine YELLOW  YELLOW   APPearance CLEAR  CLEAR   Specific Gravity, Urine <1.005 (*) 1.005 - 1.030   pH 6.5  5.0 - 8.0   Glucose, UA NEGATIVE  NEGATIVE mg/dL   Hgb urine dipstick NEGATIVE  NEGATIVE   Bilirubin Urine NEGATIVE  NEGATIVE   Ketones, ur NEGATIVE  NEGATIVE mg/dL   Protein, ur NEGATIVE  NEGATIVE mg/dL   Urobilinogen, UA 0.2  0.0 - 1.0 mg/dL   Nitrite NEGATIVE  NEGATIVE   Leukocytes, UA NEGATIVE  NEGATIVE    IMAGING US Ob Comp Less 14 Wks  03/21/2013   CLINICAL DATA:  Pelvic pain and cramping. 8 week 1 day gestational age by LMP.  EXAM: OBSTETRIC <14 WK ULTRASOUND  TECHNIQUE: Transabdominal ultrasound was performed for evaluation of the gestation  as well as the maternal uterus and adnexal regions.  COMPARISON:  None.  FINDINGS: Intrauterine gestational sac: Visualized/normal in shape.  Yolk sac:  Visualized  Embryo:  Visualized  Cardiac Activity: Visualized  Heart Rate: 146 bpm  CRL:   14  mm   7 w 5 d                  [place] EDC: 11/02/13  Maternal uterus/adnexae: Small subchorionic hemorrhage noted in lower uterine segment. Right ovary is normal in appearance. Left ovary not directly visualized, however no adnexal mass or free fluid identified.  IMPRESSION: Single living IUP measuring 7 weeks 5 days, which is concordant  with LMP.  Small subchorionic hemorrhage noted.   Electronically Signed   By: Myles Rosenthal M.D.   On: 03/21/2013 14:39    ASSESSMENT 1. Normal IUP (intrauterine pregnancy) on prenatal ultrasound   2. Abdominal pain complicating pregnancy   3. Bacterial vaginosis     PLAN Discharge home Flagyl BID x7 days Metrogel Q Hs x5 nights (pt unsure if she can afford) F/U with early prenatal care Return to MAU as needed    Medication List    ASK your doctor about these medications       prenatal multivitamin Tabs tablet  Take 1 tablet by mouth daily at 12 noon.     promethazine 25 MG tablet  Commonly known as:  PHENERGAN  Take 1 tablet (25 mg total) by mouth every 6 (six) hours as needed for nausea.         Sharen Counter Certified Nurse-Midwife 03/21/2013  1:26 PM

## 2013-03-21 NOTE — MAU Note (Signed)
Woke up out of sleep with really bad cramps in lower abd.

## 2013-03-21 NOTE — MAU Note (Signed)
Patient presents with complaint that a sharp abdominal pain awoke her this morning.

## 2013-03-22 LAB — GC/CHLAMYDIA PROBE AMP
CT Probe RNA: NEGATIVE
GC Probe RNA: NEGATIVE

## 2013-05-01 ENCOUNTER — Encounter (HOSPITAL_COMMUNITY): Payer: Self-pay | Admitting: *Deleted

## 2013-05-01 ENCOUNTER — Inpatient Hospital Stay (HOSPITAL_COMMUNITY)
Admission: AD | Admit: 2013-05-01 | Discharge: 2013-05-01 | Disposition: A | Discharge status: Home / Self Care | Payer: Medicaid Other | Source: Ambulatory Visit | Attending: Obstetrics & Gynecology | Admitting: Obstetrics & Gynecology

## 2013-05-01 DIAGNOSIS — O26899 Other specified pregnancy related conditions, unspecified trimester: Secondary | ICD-10-CM

## 2013-05-01 DIAGNOSIS — O9989 Other specified diseases and conditions complicating pregnancy, childbirth and the puerperium: Secondary | ICD-10-CM

## 2013-05-01 DIAGNOSIS — N949 Unspecified condition associated with female genital organs and menstrual cycle: Secondary | ICD-10-CM

## 2013-05-01 DIAGNOSIS — O99891 Other specified diseases and conditions complicating pregnancy: Secondary | ICD-10-CM | POA: Insufficient documentation

## 2013-05-01 DIAGNOSIS — R1031 Right lower quadrant pain: Secondary | ICD-10-CM | POA: Insufficient documentation

## 2013-05-01 LAB — URINALYSIS, ROUTINE W REFLEX MICROSCOPIC
Bilirubin Urine: NEGATIVE
Glucose, UA: NEGATIVE mg/dL
Ketones, ur: NEGATIVE mg/dL
Leukocytes, UA: NEGATIVE
Nitrite: NEGATIVE
Protein, ur: NEGATIVE mg/dL
Urobilinogen, UA: 0.2 mg/dL (ref 0.0–1.0)
pH: 8.5 — ABNORMAL HIGH (ref 5.0–8.0)

## 2013-05-01 LAB — CBC WITH DIFFERENTIAL/PLATELET
Basophils Absolute: 0 10*3/uL (ref 0.0–0.1)
Basophils Relative: 0 % (ref 0–1)
Eosinophils Absolute: 0.1 10*3/uL (ref 0.0–0.7)
HCT: 34.1 % — ABNORMAL LOW (ref 36.0–46.0)
Hemoglobin: 11.4 g/dL — ABNORMAL LOW (ref 12.0–15.0)
Lymphocytes Relative: 22 % (ref 12–46)
Lymphs Abs: 1.3 10*3/uL (ref 0.7–4.0)
MCV: 77 fL — ABNORMAL LOW (ref 78.0–100.0)
Monocytes Absolute: 0.2 10*3/uL (ref 0.1–1.0)
Monocytes Relative: 4 % (ref 3–12)
Neutro Abs: 4 10*3/uL (ref 1.7–7.7)
RBC: 4.43 MIL/uL (ref 3.87–5.11)
WBC: 5.6 10*3/uL (ref 4.0–10.5)

## 2013-05-01 MED ORDER — ACETAMINOPHEN 500 MG PO TABS
1000.0000 mg | ORAL_TABLET | Freq: Once | ORAL | Status: AC
  Administered 2013-05-01: 1000 mg via ORAL
  Filled 2013-05-01: qty 2

## 2013-05-01 NOTE — MAU Note (Addendum)
Having nose bleed when called pt to triage.  Has never had one before.  Came in for cramping in RLQ, thought it was maybe because she hadn't eaten- when she ate something it felt worse.  Nauseated earlier, that has passed and she feels light headed. No diarrhea or constipation. No urinary complaints.

## 2013-05-01 NOTE — MAU Provider Note (Signed)
Attestation of Attending Supervision of Advanced Practitioner (CNM/NP): Evaluation and management procedures were performed by the Advanced Practitioner under my supervision and collaboration.  I have reviewed the Advanced Practitioner's note and chart, and I agree with the management and plan.  HARRAWAY-SMITH, Carlton Sweaney 7:54 PM

## 2013-05-01 NOTE — MAU Provider Note (Signed)
History     CSN: [id number]  Arrival date and time: 05/01/13 1622   First Provider Initiated Contact with Patient 05/01/13 1823      Chief Complaint  Patient presents with  . Abdominal Pain   HPI  Miranda Esparza is a 22 y.o. G2P0010 at [redacted]w[redacted]d who presents today with RLQ. She states that the pain started around 1500 today, and after eating the pain was worse. She rates the pain 9/10. She denies any fever, nausea, vomiting. She last had a BM earlier today. She denies any vaginal discharge or vaginal bleeding. She has not started [facility] at this time. She is unsure of where she will go.  Past Medical History  Diagnosis Date  . Shortness of breath     since having bronchitis May 2014  . Anxiety   . Depression     Past Surgical History  Procedure Laterality Date  . Thumb surgery  2010    Family History  Problem Relation Age of Onset  . Hypertension Paternal Grandmother     History  Substance Use Topics  . Smoking status: Never Smoker   . Smokeless tobacco: Never Used  . Alcohol Use: Yes     Comment: occasional    Allergies:  Allergies  Allergen Reactions  . Latex Other (See Comments)    Latex condoms cause yeast infection.    Prescriptions prior to admission  Medication Sig Dispense Refill  . Prenatal Vit-Fe Fumarate-FA (PRENATAL MULTIVITAMIN) TABS tablet Take 1 tablet by mouth daily at 12 noon.        ROS Physical Exam   Blood pressure 117/57, pulse 71, temperature 99 F (37.2 C), temperature source Oral, resp. rate 18, last menstrual period 01/23/2013.  Physical Exam  Nursing note and vitals reviewed. Constitutional: She is oriented to person, place, and time. She appears well-developed and well-nourished. No distress.  Cardiovascular: Normal rate.   Respiratory: Effort normal.  GI: Soft. There is no tenderness. There is no rebound and no guarding.  Genitourinary:   External: no lesion Vagina: small amount of white discharge Cervix: pink, smooth,  closed  Uterus: AGA, fht 150    Neurological: She is alert and oriented to person, place, and time.  Skin: Skin is warm.  Psychiatric: She has a normal mood and affect.    MAU Course  Procedures  Results for orders placed during the hospital encounter of 05/01/13 (from the past 24 hour(s))  URINALYSIS, ROUTINE W REFLEX MICROSCOPIC     Status: Abnormal   Collection Time    05/01/13  4:40 PM      Result Value Range   Color, Urine YELLOW  YELLOW   APPearance HAZY (*) CLEAR   Specific Gravity, Urine 1.015  1.005 - 1.030   pH 8.5 (*) 5.0 - 8.0   Glucose, UA NEGATIVE  NEGATIVE mg/dL   Hgb urine dipstick NEGATIVE  NEGATIVE   Bilirubin Urine NEGATIVE  NEGATIVE   Ketones, ur NEGATIVE  NEGATIVE mg/dL   Protein, ur NEGATIVE  NEGATIVE mg/dL   Urobilinogen, UA 0.2  0.0 - 1.0 mg/dL   Nitrite NEGATIVE  NEGATIVE   Leukocytes, UA NEGATIVE  NEGATIVE  WET PREP, GENITAL     Status: Abnormal   Collection Time    05/01/13  6:54 PM      Result Value Range   Yeast Wet Prep HPF POC NONE SEEN  NONE SEEN   Trich, Wet Prep NONE SEEN  NONE SEEN   Clue Cells Wet Prep HPF POC  NONE SEEN  NONE SEEN   WBC, Wet Prep HPF POC FEW (*) NONE SEEN  CBC WITH DIFFERENTIAL     Status: Abnormal   Collection Time    05/01/13  6:56 PM      Result Value Range   WBC 5.6  4.0 - 10.5 K/uL   RBC 4.43  3.87 - 5.11 MIL/uL   Hemoglobin 11.4 (*) 12.0 - 15.0 g/dL   HCT 40.9 (*) 81.1 - 91.4 %   MCV 77.0 (*) 78.0 - 100.0 fL   MCH 25.7 (*) 26.0 - 34.0 pg   MCHC 33.4  30.0 - 36.0 g/dL   RDW 78.2  95.6 - 21.3 %   Platelets 248  150 - 400 K/uL   Neutrophils Relative % 72  43 - 77 %   Neutro Abs 4.0  1.7 - 7.7 K/uL   Lymphocytes Relative 22  12 - 46 %   Lymphs Abs 1.3  0.7 - 4.0 K/uL   Monocytes Relative 4  3 - 12 %   Monocytes Absolute 0.2  0.1 - 1.0 K/uL   Eosinophils Relative 1  0 - 5 %   Eosinophils Absolute 0.1  0.0 - 0.7 K/uL   Basophils Relative 0  0 - 1 %   Basophils Absolute 0.0  0.0 - 0.1 K/uL     Assessment  and Plan   1. Pain of round ligament complicating pregnancy, antepartum    Comfort measures reviewed Second trimester precautions Start [facility] as soon as possible  Return to MAU as needed    Tawnya Crook 05/01/2013, 6:44 PM

## 2013-05-01 NOTE — MAU Note (Signed)
Pt states she has been having abdominal cramps that have moved up to her right side

## 2013-05-03 LAB — GC/CHLAMYDIA PROBE AMP: GC Probe RNA: NEGATIVE

## 2013-05-15 LAB — OB RESULTS CONSOLE RPR: RPR: NONREACTIVE

## 2013-05-15 LAB — OB RESULTS CONSOLE HIV ANTIBODY (ROUTINE TESTING): HIV: NONREACTIVE

## 2013-05-15 LAB — OB RESULTS CONSOLE HEPATITIS B SURFACE ANTIGEN: Hepatitis B Surface Ag: NEGATIVE

## 2013-05-15 LAB — OB RESULTS CONSOLE RUBELLA ANTIBODY, IGM: RUBELLA: IMMUNE

## 2013-05-17 NOTE — L&D Delivery Note (Signed)
Delivery Note At 2:38 PM a viable female "Miranda Esparza" was delivered via Vaginal, Spontaneous Delivery (Presentation: OA restituting to Right Occiput Anterior).  APGAR: 8, 9; weight pending .   Placenta status: Intact, Spontaneous.  Cord: 3 vessels with the following complications: Tight cord around left ankle, manually reduced.  Cord pH: NA  Anesthesia: Epidural  Episiotomy: None Lacerations: Bilateral periurethral - hemostatic; no repair required and vaginal skid mark - no repair required Est. Blood Loss (mL): 400  Mom to postpartum.  Baby to Couplet care / Skin to Skin.  Mom plans to breastfeed and plans outpt circ.  Sherre ScarletWILLIAMS, Arra Connaughton 10/29/2013, 3:13 PM

## 2013-05-25 ENCOUNTER — Emergency Department (HOSPITAL_COMMUNITY)
Admission: EM | Admit: 2013-05-25 | Discharge: 2013-05-25 | Disposition: A | Discharge status: Home / Self Care | Payer: Medicaid Other | Source: Home / Self Care | Attending: Family Medicine | Admitting: Family Medicine

## 2013-05-25 ENCOUNTER — Encounter (HOSPITAL_COMMUNITY): Payer: Self-pay | Admitting: Emergency Medicine

## 2013-05-25 DIAGNOSIS — R002 Palpitations: Secondary | ICD-10-CM

## 2013-05-25 NOTE — ED Notes (Signed)
C/o sob with episodes of palpitations. Denies cardiac hx and chest pain.  Pt is currently 4 1/2 months pregnant.  States this is the first time this has ever happened.  Denies any other symptoms.  Onset 05/24/12

## 2013-05-25 NOTE — Discharge Instructions (Signed)
Thank you for coming in today. Follow up with Cardiology.  Call them on Monday and get an appointment.  Call or go to the emergency room if you get worse, have trouble breathing, have chest pains, or palpitations.

## 2013-05-25 NOTE — ED Provider Notes (Signed)
Miranda Esparza is a 23 y.o. female who presents to Urgent Care today for palpitations. Patient has palpitations starting yesterday continuing on that today. She denies any significant chest pain shortness of breath syncope or dizziness with this. She has not tried any medications. She denies any alcohol or caffeine intake. She is approximately 4 months pregnant. Her pregnancy is currently going normally with no complications.  Of note she had an episode of vasovagal syncope in 2012 which was evaluated with inpatient monitoring and echocardiogram both of which were normal.  She was thought to have vasovagal syncope and panic attack.   Past Medical History  Diagnosis Date  . Shortness of breath     since having bronchitis May 2014  . Anxiety   . Depression    History  Substance Use Topics  . Smoking status: Never Smoker   . Smokeless tobacco: Never Used  . Alcohol Use: Yes     Comment: occasional   ROS as above Medications reviewed. No current facility-administered medications for this encounter.   Current Outpatient Prescriptions  Medication Sig Dispense Refill  . Prenatal Vit-Fe Fumarate-FA (PRENATAL MULTIVITAMIN) TABS tablet Take 1 tablet by mouth daily at 12 noon.        Exam:  BP 113/57  Pulse 82  Temp(Src) 98.7 F (37.1 C) (Oral)  Resp 20  SpO2 100%  LMP 01/23/2013 Gen: Well NAD HEENT: EOMI,  MMM Lungs: Normal work of breathing. CTABL Heart: RRR no MRG Abd: NABS, Soft. NT, ND Exts: Non edematous BL  LE, warm and well perfused.   Twelve-lead EKG including one minute rhythm strip show: Normal sinus rhythm at 86 beats per minute. QTC 440 no ST segment changes. Normal EKG. No arrhythmia on rhythm strip.   Assessment and Plan: 23 y.o. female with  palpitations. Unclear etiology. Possibly PAC. This may also be panic attack.  I refer patient to cardiology for further evaluation and management. Patient will likely need Holter monitor. Also followup with  PCP  Discussed warning signs or symptoms. Please see discharge instructions. Patient expresses understanding.    Rodolph BongEvan S Baird Polinski, MD 05/25/13 2134

## 2013-05-27 ENCOUNTER — Encounter (HOSPITAL_COMMUNITY): Payer: Self-pay

## 2013-05-27 ENCOUNTER — Inpatient Hospital Stay (HOSPITAL_COMMUNITY)
Admission: AD | Admit: 2013-05-27 | Discharge: 2013-05-27 | Disposition: A | Discharge status: Home / Self Care | Payer: Medicaid Other | Source: Ambulatory Visit | Attending: Obstetrics and Gynecology | Admitting: Obstetrics and Gynecology

## 2013-05-27 DIAGNOSIS — O2650 Maternal hypotension syndrome, unspecified trimester: Secondary | ICD-10-CM | POA: Insufficient documentation

## 2013-05-27 DIAGNOSIS — F410 Panic disorder [episodic paroxysmal anxiety] without agoraphobia: Secondary | ICD-10-CM | POA: Insufficient documentation

## 2013-05-27 DIAGNOSIS — R55 Syncope and collapse: Secondary | ICD-10-CM | POA: Diagnosis present

## 2013-05-27 HISTORY — DX: Cardiac murmur, unspecified: R01.1

## 2013-05-27 LAB — CBC WITH DIFFERENTIAL/PLATELET
BASOS ABS: 0 10*3/uL (ref 0.0–0.1)
Basophils Relative: 0 % (ref 0–1)
EOS PCT: 1 % (ref 0–5)
Eosinophils Absolute: 0.1 10*3/uL (ref 0.0–0.7)
HEMATOCRIT: 31.8 % — AB (ref 36.0–46.0)
HEMOGLOBIN: 10.8 g/dL — AB (ref 12.0–15.0)
Lymphocytes Relative: 17 % (ref 12–46)
Lymphs Abs: 1.2 10*3/uL (ref 0.7–4.0)
MCH: 26.2 pg (ref 26.0–34.0)
MCHC: 34 g/dL (ref 30.0–36.0)
MCV: 77 fL — AB (ref 78.0–100.0)
MONO ABS: 0.3 10*3/uL (ref 0.1–1.0)
MONOS PCT: 4 % (ref 3–12)
NEUTROS ABS: 5.4 10*3/uL (ref 1.7–7.7)
Neutrophils Relative %: 78 % — ABNORMAL HIGH (ref 43–77)
Platelets: 195 10*3/uL (ref 150–400)
RBC: 4.13 MIL/uL (ref 3.87–5.11)
RDW: 14.5 % (ref 11.5–15.5)
WBC: 7 10*3/uL (ref 4.0–10.5)

## 2013-05-27 LAB — COMPREHENSIVE METABOLIC PANEL
ALT: 25 U/L (ref 0–35)
AST: 31 U/L (ref 0–37)
Albumin: 3 g/dL — ABNORMAL LOW (ref 3.5–5.2)
Alkaline Phosphatase: 55 U/L (ref 39–117)
BUN: 6 mg/dL (ref 6–23)
CALCIUM: 8.8 mg/dL (ref 8.4–10.5)
CHLORIDE: 103 meq/L (ref 96–112)
CO2: 24 meq/L (ref 19–32)
CREATININE: 0.54 mg/dL (ref 0.50–1.10)
GFR calc Af Amer: >90 mL/min (ref >90)
Glucose, Bld: 87 mg/dL (ref 70–99)
Potassium: 4 mEq/L (ref 3.7–5.3)
Sodium: 137 mEq/L (ref 137–147)
Total Bilirubin: 0.4 mg/dL (ref 0.3–1.2)
Total Protein: 6.2 g/dL (ref 6.0–8.3)

## 2013-05-27 LAB — URINALYSIS, ROUTINE W REFLEX MICROSCOPIC
Bilirubin Urine: NEGATIVE
Glucose, UA: NEGATIVE mg/dL
Hgb urine dipstick: NEGATIVE
Ketones, ur: NEGATIVE mg/dL
LEUKOCYTES UA: NEGATIVE
Nitrite: NEGATIVE
PROTEIN: NEGATIVE mg/dL
Specific Gravity, Urine: 1.01 (ref 1.005–1.030)
Urobilinogen, UA: 0.2 mg/dL (ref 0.0–1.0)
pH: 6.5 (ref 5.0–8.0)

## 2013-05-27 MED ORDER — ACETAMINOPHEN 325 MG PO TABS
650.0000 mg | ORAL_TABLET | Freq: Four times a day (QID) | ORAL | Status: DC | PRN
  Administered 2013-05-27: 650 mg via ORAL
  Filled 2013-05-27: qty 2

## 2013-05-27 MED ORDER — HYDROXYZINE PAMOATE 50 MG PO CAPS: 50.0000 mg | ORAL_CAPSULE | Freq: Three times a day (TID) | ORAL | Status: DC | PRN

## 2013-05-27 MED ORDER — HYDROXYZINE HCL 50 MG PO TABS
50.0000 mg | ORAL_TABLET | Freq: Once | ORAL | Status: AC
  Administered 2013-05-27: 50 mg via ORAL
  Filled 2013-05-27: qty 1

## 2013-05-27 NOTE — MAU Note (Addendum)
Heart has been "fluttering & skipping beats" x 2 days. In last 30 minutes heart has been racing and blacked out. When heart feels like this, has difficulty breathing. Denies fever/cough/sore throat. Denies abdominal pain or vaginal bleeding.

## 2013-05-27 NOTE — MAU Provider Note (Signed)
History   23 yo G2P0010 at 2 5/7 weeks presented unannounced c/o syncopal episode, with associated fluttering and skipping of heart beat.  Seen at Urgent Care 1/9 for same sx, without syncope.  Had normal EKG there and was instructed to call for cardiology referral.  Reports mild HA since event--felt 'fainty", sat down, then passed out for approx 1 min per her report.  Did not fall or strike abdomen or head.   No incontinence or post-ictal activity. No recent illnesses, ate normal diet tonight around 5p.  Denies any significant anxieties at present.  Hx anxiety/depression in 2011--no recent issues or meds.  Had NOB interview 12/30, with PN labs done.  Hgb 11/3, urine culture with 15K enterococcus, negative GC/chlamydia.  Has NOB w/u scheduled for 05/30/13.  EPIC records show an admission in 2012 for similar c/o (syncope, SOB, chest pain)--was admitted for cardiac evaluation, with normal EKG, chest CT, and echo.  Prior to the admission, she reported 1 previous episode of syncope that was attributed to anxiety. D/C diagnoses included: 1. Syncope, likely vasovagal.  2. Mild orthostasis, resolving.  3. Probable viral illness.  4. Hypokalemia, resolved.  5. Murmur, benign.   Chief Complaint  Patient presents with  . Tachycardia  . Loss of Consciousness  . Shortness of Breath   HPI:  See above  OB History   Grav Para Term Preterm Abortions TAB SAB Ect Mult Living   2    1  1    0      Past Medical History  Diagnosis Date  . Shortness of breath     since having bronchitis May 2014  . Anxiety   . Depression   . Heart murmur     Past Surgical History  Procedure Laterality Date  . Thumb surgery  2010    Family History  Problem Relation Age of Onset  . Hypertension Paternal Grandmother     History  Substance Use Topics  . Smoking status: Never Smoker   . Smokeless tobacco: Never Used  . Alcohol Use: No     Comment: occasional    Allergies:  Allergies  Allergen Reactions   . Latex Other (See Comments)    Latex condoms cause yeast infection.    Prescriptions prior to admission  Medication Sig Dispense Refill  . albuterol (PROVENTIL HFA;VENTOLIN HFA) 108 (90 BASE) MCG/ACT inhaler Inhale 2 puffs into the lungs every 6 (six) hours as needed for wheezing or shortness of breath.      . Prenatal Vit-Fe Fumarate-FA (PRENATAL MULTIVITAMIN) TABS tablet Take 1 tablet by mouth daily at 12 noon.        ROS:  Mild HA, recent syncopal episode, "feels like a fast HR"  Physical Exam   Blood pressure 127/62, pulse 89, temperature 99.2 F (37.3 C), temperature source Oral, resp. rate 24, height 5\' 1"  (1.549 m), weight 109 lb 3.2 oz (49.533 kg), last menstrual period 01/23/2013, SpO2 100.00%.  Physical Exam In NAD--no SOB noted. PERL, normal grip strength and full ROM all extremities. Normal pulses (carotid, apical, radial, femoral, popliteal, and dorsalis pedis)--all equal and strong. Chest clear Heart RRR with mild 1/6 systolic murmur Abd gravid, NT Negative CVAT Pelvic--deferred Ext--DTR 1+ without clonus, no edema, negative Homan's  FHR 143 No UCs.  ED Course  IUP at 17 5/7 weeks Syncopal episode/panic attack, with hx of same  Plan: Consulted with Dr. Estanislado Pandy CBC, diff, CMP, TSH Vistaril po now Plan referral to cardiology this week from [facility].   Breydon Senters,  Carlissa Pesola CNM, [place] 05/27/2013 10:31 PM  Addendum: Feeling fine at present.  Results for orders placed during the hospital encounter of 05/27/13 (from the past 24 hour(s))  URINALYSIS, ROUTINE W REFLEX MICROSCOPIC     Status: None   Collection Time    05/27/13  9:39 PM      Result Value Range   Color, Urine YELLOW  YELLOW   APPearance CLEAR  CLEAR   Specific Gravity, Urine 1.010  1.005 - 1.030   pH 6.5  5.0 - 8.0   Glucose, UA NEGATIVE  NEGATIVE mg/dL   Hgb urine dipstick NEGATIVE  NEGATIVE   Bilirubin Urine NEGATIVE  NEGATIVE   Ketones, ur NEGATIVE  NEGATIVE mg/dL   Protein, ur NEGATIVE  NEGATIVE  mg/dL   Urobilinogen, UA 0.2  0.0 - 1.0 mg/dL   Nitrite NEGATIVE  NEGATIVE   Leukocytes, UA NEGATIVE  NEGATIVE  CBC WITH DIFFERENTIAL     Status: Abnormal   Collection Time    05/27/13 10:40 PM      Result Value Range   WBC 7.0  4.0 - 10.5 K/uL   RBC 4.13  3.87 - 5.11 MIL/uL   Hemoglobin 10.8 (*) 12.0 - 15.0 g/dL   HCT 40.9 (*) 81.1 - 91.4 %   MCV 77.0 (*) 78.0 - 100.0 fL   MCH 26.2  26.0 - 34.0 pg   MCHC 34.0  30.0 - 36.0 g/dL   RDW 78.2  95.6 - 21.3 %   Platelets 195  150 - 400 K/uL   Neutrophils Relative % 78 (*) 43 - 77 %   Neutro Abs 5.4  1.7 - 7.7 K/uL   Lymphocytes Relative 17  12 - 46 %   Lymphs Abs 1.2  0.7 - 4.0 K/uL   Monocytes Relative 4  3 - 12 %   Monocytes Absolute 0.3  0.1 - 1.0 K/uL   Eosinophils Relative 1  0 - 5 %   Eosinophils Absolute 0.1  0.0 - 0.7 K/uL   Basophils Relative 0  0 - 1 %   Basophils Absolute 0.0  0.0 - 0.1 K/uL  COMPREHENSIVE METABOLIC PANEL     Status: Abnormal   Collection Time    05/27/13 10:40 PM      Result Value Range   Sodium 137  137 - 147 mEq/L   Potassium 4.0  3.7 - 5.3 mEq/L   Chloride 103  96 - 112 mEq/L   CO2 24  19 - 32 mEq/L   Glucose, Bld 87  70 - 99 mg/dL   BUN 6  6 - 23 mg/dL   Creatinine, Ser 0.86  0.50 - 1.10 mg/dL   Calcium 8.8  8.4 - 57.8 mg/dL   Total Protein 6.2  6.0 - 8.3 g/dL   Albumin 3.0 (*) 3.5 - 5.2 g/dL   AST 31  0 - 37 U/L   ALT 25  0 - 35 U/L   Alkaline Phosphatase 55  39 - 117 U/L   Total Bilirubin 0.4  0.3 - 1.2 mg/dL   GFR calc non Af Amer >90  >90 mL/min   GFR calc Af Amer >90  >90 mL/min   TSH pending.  Plan: D/C home with precautions for syncope. Rx Vistaril 50 mg po TID prn anxiety or fast HR--sent to pharmacy. Push po fluids, increase rest, avoid caffeine. Office will coordinate referral ASAP to cardiologist at [facility]. Safety precautions for syncopal episodes reviewed. Keep scheduled appt at [facility] 05/30/13, or  call prn.  Nigel BridgemanVicki Gabreal Worton, CNM 05/27/13 [id number]

## 2013-05-27 NOTE — Discharge Instructions (Signed)
Take Vistaril if needed for feelings of fast heart rate or anxiety. Make sure you drink plenty of fluids, avoid caffeine.  Syncope Syncope is a fainting spell. This means the person loses consciousness and drops to the ground. The person is generally unconscious for less than 5 minutes. The person may have some muscle twitches for up to 15 seconds before waking up and returning to normal. Syncope occurs more often in elderly people, but it can happen to anyone. While most causes of syncope are not dangerous, syncope can be a sign of a serious medical problem. It is important to seek medical care.  CAUSES  Syncope is caused by a sudden decrease in blood flow to the brain. The specific cause is often not determined. Factors that can trigger syncope include:  Taking medicines that lower blood pressure.  Sudden changes in posture, such as standing up suddenly.  Taking more medicine than prescribed.  Standing in one place for too long.  Seizure disorders.  Dehydration and excessive exposure to heat.  Low blood sugar (hypoglycemia).  Straining to have a bowel movement.  Heart disease, irregular heartbeat, or other circulatory problems.  Fear, emotional distress, seeing blood, or severe pain. SYMPTOMS  Right before fainting, you may:  Feel dizzy or lightheaded.  Feel nauseous.  See all white or all black in your field of vision.  Have cold, clammy skin. DIAGNOSIS  Your caregiver will ask about your symptoms, perform a physical exam, and perform electrocardiography (ECG) to record the electrical activity of your heart. Your caregiver may also perform other heart or blood tests to determine the cause of your syncope. TREATMENT  In most cases, no treatment is needed. Depending on the cause of your syncope, your caregiver may recommend changing or stopping some of your medicines. HOME CARE INSTRUCTIONS  Have someone stay with you until you feel stable.  Do not drive, operate  machinery, or play sports until your caregiver says it is okay.  Keep all follow-up appointments as directed by your caregiver.  Lie down right away if you start feeling like you might faint. Breathe deeply and steadily. Wait until all the symptoms have passed.  Drink enough fluids to keep your urine clear or pale yellow.  If you are taking blood pressure or heart medicine, get up slowly, taking several minutes to sit and then stand. This can reduce dizziness. SEEK IMMEDIATE MEDICAL CARE IF:   You have a severe headache.  You have unusual pain in the chest, abdomen, or back.  You are bleeding from the mouth or rectum, or you have black or tarry stool.  You have an irregular or very fast heartbeat.  You have pain with breathing.  You have repeated fainting or seizure-like jerking during an episode.  You faint when sitting or lying down.  You have confusion.  You have difficulty walking.  You have severe weakness.  You have vision problems. If you fainted, call your local emergency services (911 in [place] Do not drive yourself to the hospital.  MAKE SURE YOU:  Understand these instructions.  Will watch your condition.  Will get help right away if you are not doing well or get worse. Document Released: 05/03/2005 Document Revised: 11/02/2011 Document Reviewed: 07/02/2011 [facility] Patient Information 2014 [place], [place].

## 2013-05-28 LAB — TSH: TSH: 0.848 u[IU]/mL (ref 0.350–4.500)

## 2013-08-24 ENCOUNTER — Inpatient Hospital Stay (HOSPITAL_COMMUNITY)
Admission: AD | Admit: 2013-08-24 | Discharge: 2013-08-24 | Disposition: A | Discharge status: Home / Self Care | Payer: Medicaid Other | Source: Ambulatory Visit | Attending: Obstetrics and Gynecology | Admitting: Obstetrics and Gynecology

## 2013-08-24 ENCOUNTER — Encounter (HOSPITAL_COMMUNITY): Payer: Self-pay

## 2013-08-24 DIAGNOSIS — O99891 Other specified diseases and conditions complicating pregnancy: Secondary | ICD-10-CM | POA: Insufficient documentation

## 2013-08-24 DIAGNOSIS — O9989 Other specified diseases and conditions complicating pregnancy, childbirth and the puerperium: Principal | ICD-10-CM

## 2013-08-24 DIAGNOSIS — O368190 Decreased fetal movements, unspecified trimester, not applicable or unspecified: Secondary | ICD-10-CM | POA: Insufficient documentation

## 2013-08-24 DIAGNOSIS — R109 Unspecified abdominal pain: Secondary | ICD-10-CM | POA: Insufficient documentation

## 2013-08-24 DIAGNOSIS — M549 Dorsalgia, unspecified: Secondary | ICD-10-CM | POA: Insufficient documentation

## 2013-08-24 LAB — URINALYSIS, ROUTINE W REFLEX MICROSCOPIC
Bilirubin Urine: NEGATIVE
Glucose, UA: NEGATIVE mg/dL
Hgb urine dipstick: NEGATIVE
Ketones, ur: NEGATIVE mg/dL
NITRITE: NEGATIVE
Protein, ur: NEGATIVE mg/dL
SPECIFIC GRAVITY, URINE: 1.01 (ref 1.005–1.030)
UROBILINOGEN UA: 0.2 mg/dL (ref 0.0–1.0)
pH: 7.5 (ref 5.0–8.0)

## 2013-08-24 LAB — WET PREP, GENITAL
Clue Cells Wet Prep HPF POC: NONE SEEN
Trich, Wet Prep: NONE SEEN
Yeast Wet Prep HPF POC: NONE SEEN

## 2013-08-24 LAB — URINE MICROSCOPIC-ADD ON

## 2013-08-24 LAB — FETAL FIBRONECTIN: FETAL FIBRONECTIN: NEGATIVE

## 2013-08-24 NOTE — MAU Note (Signed)
Patient states she has been having lower abdominal cramping since last night. Has an increase in her discharge. Denies bleeding or leaking. Reports fetal movement but not as much as usual.

## 2013-08-24 NOTE — MAU Provider Note (Signed)
History   Patient presents complaining of lower abdominal cramping, back pain, and decreased fetal movement.  Patient states pain started last night and patient tried hydration and position change without success.  Patient states that "tylenol doesn't do anything" for the pain and did not attempt this.  Patient currently works as a Theatre stage managerhostess and reports working "a double shift from [employer]" on Wednesday.  Patient reports that fetus was not moving "as much" prior to arrival at hospital.  Patient reports eating "french toast and orange juice" this am, but fetus was not active.  Patient denies VB, but reports increase in vaginal discharge.   Patient Active Problem List   Diagnosis Date Noted  . Syncopal episodes 05/27/2013    Chief Complaint  Patient presents with  . Abdominal Cramping  . Vaginal Discharge   HPI  OB History   Grav Para Term Preterm Abortions TAB SAB Ect Mult Living   2    1  1    0      Past Medical History  Diagnosis Date  . Shortness of breath     since having bronchitis May 2014  . Anxiety   . Depression   . Heart murmur     Past Surgical History  Procedure Laterality Date  . Thumb surgery  2010    Family History  Problem Relation Age of Onset  . Hypertension Paternal Grandmother     History  Substance Use Topics  . Smoking status: Never Smoker   . Smokeless tobacco: Never Used  . Alcohol Use: No     Comment: occasional    Allergies:  Allergies  Allergen Reactions  . Latex Other (See Comments)    Latex condoms cause yeast infection.    Prescriptions prior to admission  Medication Sig Dispense Refill  . albuterol (PROVENTIL HFA;VENTOLIN HFA) 108 (90 BASE) MCG/ACT inhaler Inhale 2 puffs into the lungs every 6 (six) hours as needed for wheezing or shortness of breath.      . hydrOXYzine (VISTARIL) 50 MG capsule Take 1 capsule (50 mg total) by mouth 3 (three) times daily as needed.  30 capsule  0  . Prenatal Vit-Fe Fumarate-FA (PRENATAL  MULTIVITAMIN) TABS tablet Take 1 tablet by mouth daily at 12 noon.        ROS SEE HPI ABOVE Physical Exam   Blood pressure 115/56, pulse 98, temperature 98.7 F (37.1 C), temperature source Oral, resp. rate 16, height 5' 0.5" (1.537 m), weight 121 lb 1.6 oz (54.931 kg), last menstrual period 01/16/2013, SpO2 99.00%.  Results for orders placed during the hospital encounter of 08/24/13 (from the past 24 hour(s))  URINALYSIS, ROUTINE W REFLEX MICROSCOPIC     Status: Abnormal   Collection Time    08/24/13 11:35 AM      Result Value Ref Range   Color, Urine YELLOW  YELLOW   APPearance HAZY (*) CLEAR   Specific Gravity, Urine 1.010  1.005 - 1.030   pH 7.5  5.0 - 8.0   Glucose, UA NEGATIVE  NEGATIVE mg/dL   Hgb urine dipstick NEGATIVE  NEGATIVE   Bilirubin Urine NEGATIVE  NEGATIVE   Ketones, ur NEGATIVE  NEGATIVE mg/dL   Protein, ur NEGATIVE  NEGATIVE mg/dL   Urobilinogen, UA 0.2  0.0 - 1.0 mg/dL   Nitrite NEGATIVE  NEGATIVE   Leukocytes, UA SMALL (*) NEGATIVE  URINE MICROSCOPIC-ADD ON     Status: Abnormal   Collection Time    08/24/13 11:35 AM  Result Value Ref Range   Squamous Epithelial / LPF FEW (*) RARE   WBC, UA 0-2  <3 WBC/hpf   Bacteria, UA FEW (*) RARE  WET PREP, GENITAL     Status: Abnormal   Collection Time    08/24/13  1:50 PM      Result Value Ref Range   Yeast Wet Prep HPF POC NONE SEEN  NONE SEEN   Trich, Wet Prep NONE SEEN  NONE SEEN   Clue Cells Wet Prep HPF POC NONE SEEN  NONE SEEN   WBC, Wet Prep HPF POC FEW (*) NONE SEEN  FETAL FIBRONECTIN     Status: None   Collection Time    08/24/13  1:50 PM      Result Value Ref Range   Fetal Fibronectin NEGATIVE  NEGATIVE    Physical Exam  Constitutional: She is oriented to person, place, and time. She appears well-developed.  Cardiovascular: Normal rate and regular rhythm.   Respiratory: Effort normal.  GI: Soft. She exhibits no distension. There is tenderness in the left lower quadrant. No hernia.  BLQ  tenderness upon deep palpation  Genitourinary: There is tenderness around the vagina. Vaginal discharge found.  Thin white discharge with copious mucous from os.  Cervix friable.  Bimanual examination yielded tenderness bilaterally, but ovaries not palpated.    Neurological: She is alert and oriented to person, place, and time.  Skin: Skin is warm.    ED Course  Assessment: IUP at 31.3wks Round Ligament Pain Back Pain Decreased Fetal Movement  Plan: SSE as charted Labs:fFN, Wet Prep, UrineA, GBS, Gc/CT NST-Reactive Flexeril 5mg  BID, prn x 14 days Out of work for next 2 weeks Discharge to home, await test results Office visit next week as scheduled Call if you have any questions or concerns prior to your next visit.   1600 UA-Bacteria and Leuks noted Urine culture sent fFN-Negative Wet Prep-Negative-Bacteria noted GBS-Pending Gc/CT-Pending Await urine culture results and treat accordingly.  Marlene Bast CNM, MSN 08/24/2013 12:01 PM

## 2013-08-24 NOTE — Discharge Instructions (Signed)
Cyclobenzaprine tablets  What is this medicine?  CYCLOBENZAPRINE (sye kloe BEN za preen) is a muscle relaxer. It is used to treat muscle pain, spasms, and stiffness.  This medicine may be used for other purposes; ask your health care provider or pharmacist if you have questions.  COMMON BRAND NAME(S): Fexmid, Flexeril  What should I tell my health care provider before I take this medicine?  They need to know if you have any of these conditions:  -heart disease, irregular heartbeat, or previous heart attack  -liver disease  -thyroid problem  -an unusual or allergic reaction to cyclobenzaprine, tricyclic antidepressants, lactose, other medicines, foods, dyes, or preservatives  -pregnant or trying to get pregnant  -breast-feeding  How should I use this medicine?  Take this medicine by mouth with a glass of water. Follow the directions on the prescription label. If this medicine upsets your stomach, take it with food or milk. Take your medicine at regular intervals. Do not take it more often than directed.  Talk to your pediatrician regarding the use of this medicine in children. Special care may be needed.  Overdosage: If you think you have taken too much of this medicine contact a poison control center or emergency room at once.  NOTE: This medicine is only for you. Do not share this medicine with others.  What if I miss a dose?  If you miss a dose, take it as soon as you can. If it is almost time for your next dose, take only that dose. Do not take double or extra doses.  What may interact with this medicine?  Do not take this medicine with any of the following medications:  -certain medicines for fungal infections like fluconazole, itraconazole, ketoconazole, posaconazole, voriconazole  -cisapride  -dofetilide  -dronedarone  -droperidol  -flecainide  -grepafloxacin  -halofantrine  -levomethadyl  -MAOIs like Carbex, Eldepryl, Marplan, Nardil, and  Parnate  -nilotinib  -pimozide  -probucol  -sertindole  -thioridazine  -ziprasidone   This medicine may also interact with the following medications:  -abarelix  -alcohol  -certain medicines for cancer  -certain medicines for depression, anxiety, or psychotic disturbances  -certain medicines for infection like alfuzosin, chloroquine, clarithromycin, levofloxacin, mefloquine, pentamidine, troleandomycin  -certain medicines for an irregular heart beat  -certain medicines used for sleep or numbness during surgery or procedure  -contrast dyes  -dolasetron  -guanethidine  -methadone  -octreotide  -ondansetron  -other medicines that prolong the QT interval (cause an abnormal heart rhythm)  -palonosetron  -phenothiazines like chlorpromazine, mesoridazine, prochlorperazine, thioridazine  -tramadol  -vardenafil  This list may not describe all possible interactions. Give your health care provider a list of all the medicines, herbs, non-prescription drugs, or dietary supplements you use. Also tell them if you smoke, drink alcohol, or use illegal drugs. Some items may interact with your medicine.  What should I watch for while using this medicine?  Check with your doctor or health care professional if your condition does not improve within 1 to 3 weeks.  You may get drowsy or dizzy when you first start taking the medicine or change doses. Do not drive, use machinery, or do anything that may be dangerous until you know how the medicine affects you. Stand or sit up slowly.  Your mouth may get dry. Drinking water, chewing sugarless gum, or sucking on hard candy may help.  What side effects may I notice from receiving this medicine?  Side effects that you should report to your doctor or health care professional   as soon as possible:  -allergic reactions like skin rash, itching or hives, swelling of the face, lips, or tongue  -chest pain  -fast heartbeat  -hallucinations  -seizures  -vomiting  Side effects that usually do not require  medical attention (report to your doctor or health care professional if they continue or are bothersome):  -headache  This list may not describe all possible side effects. Call your doctor for medical advice about side effects. You may report side effects to FDA at 1-800-FDA-1088.  Where should I keep my medicine?  Keep out of the reach of children.  Store at room temperature between 15 and 30 degrees C (59 and 86 degrees F). Keep container tightly closed. Throw away any unused medicine after the expiration date.  NOTE: This sheet is a summary. It may not cover all possible information. If you have questions about this medicine, talk to your doctor, pharmacist, or health care provider.  © 2014, [employer]. (2012-11-28 12:48:19)

## 2013-08-25 LAB — CULTURE, OB URINE: Colony Count: 2000

## 2013-08-25 LAB — GC/CHLAMYDIA PROBE AMP
CT Probe RNA: NEGATIVE
GC PROBE AMP APTIMA: NEGATIVE

## 2013-08-26 LAB — CULTURE, BETA STREP (GROUP B ONLY)

## 2013-09-08 ENCOUNTER — Encounter (HOSPITAL_COMMUNITY): Payer: Self-pay

## 2013-09-08 ENCOUNTER — Inpatient Hospital Stay (HOSPITAL_COMMUNITY)
Admission: AD | Admit: 2013-09-08 | Discharge: 2013-09-08 | Disposition: A | Discharge status: Home / Self Care | Payer: Medicaid Other | Source: Ambulatory Visit | Attending: Obstetrics and Gynecology | Admitting: Obstetrics and Gynecology

## 2013-09-08 DIAGNOSIS — O4700 False labor before 37 completed weeks of gestation, unspecified trimester: Secondary | ICD-10-CM | POA: Insufficient documentation

## 2013-09-08 DIAGNOSIS — F3289 Other specified depressive episodes: Secondary | ICD-10-CM | POA: Insufficient documentation

## 2013-09-08 DIAGNOSIS — R109 Unspecified abdominal pain: Secondary | ICD-10-CM | POA: Insufficient documentation

## 2013-09-08 DIAGNOSIS — R011 Cardiac murmur, unspecified: Secondary | ICD-10-CM | POA: Insufficient documentation

## 2013-09-08 DIAGNOSIS — R0602 Shortness of breath: Secondary | ICD-10-CM | POA: Insufficient documentation

## 2013-09-08 DIAGNOSIS — F329 Major depressive disorder, single episode, unspecified: Secondary | ICD-10-CM | POA: Insufficient documentation

## 2013-09-08 LAB — FETAL FIBRONECTIN: FETAL FIBRONECTIN: NEGATIVE

## 2013-09-08 LAB — URINALYSIS, ROUTINE W REFLEX MICROSCOPIC
Bilirubin Urine: NEGATIVE
GLUCOSE, UA: NEGATIVE mg/dL
Hgb urine dipstick: NEGATIVE
Ketones, ur: NEGATIVE mg/dL
LEUKOCYTES UA: NEGATIVE
Nitrite: NEGATIVE
Protein, ur: 30 mg/dL — AB
Specific Gravity, Urine: 1.025 (ref 1.005–1.030)
Urobilinogen, UA: 0.2 mg/dL (ref 0.0–1.0)
pH: 6.5 (ref 5.0–8.0)

## 2013-09-08 LAB — OB RESULTS CONSOLE GBS: GBS: NEGATIVE

## 2013-09-08 LAB — URINE MICROSCOPIC-ADD ON

## 2013-09-08 LAB — OB RESULTS CONSOLE GC/CHLAMYDIA
Chlamydia: NEGATIVE
GC PROBE AMP, GENITAL: NEGATIVE

## 2013-09-08 LAB — WET PREP, GENITAL
CLUE CELLS WET PREP: NONE SEEN
Trich, Wet Prep: NONE SEEN
Yeast Wet Prep HPF POC: NONE SEEN

## 2013-09-08 MED ORDER — NIFEDIPINE 10 MG PO CAPS
10.0000 mg | ORAL_CAPSULE | Freq: Once | ORAL | Status: AC
  Administered 2013-09-08: 10 mg via ORAL
  Filled 2013-09-08: qty 1

## 2013-09-08 MED ORDER — NIFEDIPINE 10 MG PO CAPS: 10.0000 mg | ORAL_CAPSULE | Freq: Four times a day (QID) | ORAL | Status: DC | PRN

## 2013-09-08 MED ORDER — NIFEDIPINE 10 MG PO CAPS
10.0000 mg | ORAL_CAPSULE | ORAL | Status: AC | PRN
  Administered 2013-09-08 (×2): 10 mg via ORAL
  Filled 2013-09-08 (×2): qty 1

## 2013-09-08 NOTE — Progress Notes (Signed)
Called back regarding pt. Report given. Received orders to PO hydrate and give 10mg  procardia. Will come see pt

## 2013-09-08 NOTE — Progress Notes (Signed)
Message left notifying of pt arrival in MAU and complaint. Awaiting call back

## 2013-09-08 NOTE — Discharge Instructions (Signed)
Fetal Movement Counts °Patient Name: __________________________________________________ Patient Due Date: ____________________ °Performing a fetal movement count is highly recommended in high-risk pregnancies, but it is good for every pregnant woman to do. Your caregiver may ask you to start counting fetal movements at 28 weeks of the pregnancy. Fetal movements often increase: °· After eating a full meal. °· After physical activity. °· After eating or drinking something sweet or cold. °· At rest. °Pay attention to when you feel the baby is most active. This will help you notice a pattern of your baby's sleep and wake cycles and what factors contribute to an increase in fetal movement. It is important to perform a fetal movement count at the same time each day when your baby is normally most active.  °HOW TO COUNT FETAL MOVEMENTS °1. Find a quiet and comfortable area to sit or lie down on your left side. Lying on your left side provides the best blood and oxygen circulation to your baby. °2. Write down the day and time on a sheet of paper or in a journal. °3. Start counting kicks, flutters, swishes, rolls, or jabs in a 2 hour period. You should feel at least 10 movements within 2 hours. °4. If you do not feel 10 movements in 2 hours, wait 2 3 hours and count again. Look for a change in the pattern or not enough counts in 2 hours. °SEEK MEDICAL CARE IF: °· You feel less than 10 counts in 2 hours, tried twice. °· There is no movement in over an hour. °· The pattern is changing or taking longer each day to reach 10 counts in 2 hours. °· You feel the baby is not moving as he or she usually does. °Date: ____________ Movements: ____________ Start time: ____________ Finish time: ____________  °Date: ____________ Movements: ____________ Start time: ____________ Finish time: ____________ °Date: ____________ Movements: ____________ Start time: ____________ Finish time: ____________ °Date: ____________ Movements: ____________  Start time: ____________ Finish time: ____________ °Date: ____________ Movements: ____________ Start time: ____________ Finish time: ____________ °Date: ____________ Movements: ____________ Start time: ____________ Finish time: ____________ °Date: ____________ Movements: ____________ Start time: ____________ Finish time: ____________ °Date: ____________ Movements: ____________ Start time: ____________ Finish time: ____________  °Date: ____________ Movements: ____________ Start time: ____________ Finish time: ____________ °Date: ____________ Movements: ____________ Start time: ____________ Finish time: ____________ °Date: ____________ Movements: ____________ Start time: ____________ Finish time: ____________ °Date: ____________ Movements: ____________ Start time: ____________ Finish time: ____________ °Date: ____________ Movements: ____________ Start time: ____________ Finish time: ____________ °Date: ____________ Movements: ____________ Start time: ____________ Finish time: ____________ °Date: ____________ Movements: ____________ Start time: ____________ Finish time: ____________  °Date: ____________ Movements: ____________ Start time: ____________ Finish time: ____________ °Date: ____________ Movements: ____________ Start time: ____________ Finish time: ____________ °Date: ____________ Movements: ____________ Start time: ____________ Finish time: ____________ °Date: ____________ Movements: ____________ Start time: ____________ Finish time: ____________ °Date: ____________ Movements: ____________ Start time: ____________ Finish time: ____________ °Date: ____________ Movements: ____________ Start time: ____________ Finish time: ____________ °Date: ____________ Movements: ____________ Start time: ____________ Finish time: ____________  °Date: ____________ Movements: ____________ Start time: ____________ Finish time: ____________ °Date: ____________ Movements: ____________ Start time: ____________ Finish time:  ____________ °Date: ____________ Movements: ____________ Start time: ____________ Finish time: ____________ °Date: ____________ Movements: ____________ Start time: ____________ Finish time: ____________ °Date: ____________ Movements: ____________ Start time: ____________ Finish time: ____________ °Date: ____________ Movements: ____________ Start time: ____________ Finish time: ____________ °Date: ____________ Movements: ____________ Start time: ____________ Finish time: ____________  °Date: ____________ Movements: ____________ Start time: ____________ Finish   time: ____________ °Date: ____________ Movements: ____________ Start time: ____________ Finish time: ____________ °Date: ____________ Movements: ____________ Start time: ____________ Finish time: ____________ °Date: ____________ Movements: ____________ Start time: ____________ Finish time: ____________ °Date: ____________ Movements: ____________ Start time: ____________ Finish time: ____________ °Date: ____________ Movements: ____________ Start time: ____________ Finish time: ____________ °Date: ____________ Movements: ____________ Start time: ____________ Finish time: ____________  °Date: ____________ Movements: ____________ Start time: ____________ Finish time: ____________ °Date: ____________ Movements: ____________ Start time: ____________ Finish time: ____________ °Date: ____________ Movements: ____________ Start time: ____________ Finish time: ____________ °Date: ____________ Movements: ____________ Start time: ____________ Finish time: ____________ °Date: ____________ Movements: ____________ Start time: ____________ Finish time: ____________ °Date: ____________ Movements: ____________ Start time: ____________ Finish time: ____________ °Date: ____________ Movements: ____________ Start time: ____________ Finish time: ____________  °Date: ____________ Movements: ____________ Start time: ____________ Finish time: ____________ °Date: ____________ Movements:  ____________ Start time: ____________ Finish time: ____________ °Date: ____________ Movements: ____________ Start time: ____________ Finish time: ____________ °Date: ____________ Movements: ____________ Start time: ____________ Finish time: ____________ °Date: ____________ Movements: ____________ Start time: ____________ Finish time: ____________ °Date: ____________ Movements: ____________ Start time: ____________ Finish time: ____________ °Date: ____________ Movements: ____________ Start time: ____________ Finish time: ____________  °Date: ____________ Movements: ____________ Start time: ____________ Finish time: ____________ °Date: ____________ Movements: ____________ Start time: ____________ Finish time: ____________ °Date: ____________ Movements: ____________ Start time: ____________ Finish time: ____________ °Date: ____________ Movements: ____________ Start time: ____________ Finish time: ____________ °Date: ____________ Movements: ____________ Start time: ____________ Finish time: ____________ °Date: ____________ Movements: ____________ Start time: ____________ Finish time: ____________ °Document Released: 06/02/2006 Document Revised: 04/19/2012 Document Reviewed: 02/28/2012 °[employer]® Patient Information ©2014 [employer]. ° °Preterm Labor Information °Preterm labor is when labor starts at less than 37 weeks of pregnancy. The normal length of a pregnancy is 39 to 41 weeks. °CAUSES °Often, there is no identifiable underlying cause as to why a woman goes into preterm labor. One of the most common known causes of preterm labor is infection. Infections of the uterus, cervix, vagina, amniotic sac, bladder, kidney, or even the lungs (pneumonia) can cause labor to start. Other suspected causes of preterm labor include:  °· Urogenital infections, such as yeast infections and bacterial vaginosis.   °· Uterine abnormalities (uterine shape, uterine septum, fibroids, or bleeding from the placenta).   °· A cervix that  has been operated on (it may fail to stay closed).   °· Malformations in the fetus.   °· Multiple gestations (twins, triplets, and so on).   °· Breakage of the amniotic sac.   °RISK FACTORS °· Having a previous history of preterm labor.   °· Having premature rupture of membranes (PROM).   °· Having a placenta that covers the opening of the cervix (placenta previa).   °· Having a placenta that separates from the uterus (placental abruption).   °· Having a cervix that is too weak to hold the fetus in the uterus (incompetent cervix).   °· Having too much fluid in the amniotic sac (polyhydramnios).   °· Taking illegal drugs or smoking while pregnant.   °· Not gaining enough weight while pregnant.   °· Being younger than 18 and older than 23 years old.   °· Having a low socioeconomic status.   °· Being African American. °SYMPTOMS °Signs and symptoms of preterm labor include:  °· Menstrual-like cramps, abdominal pain, or back pain. °· Uterine contractions that are regular, as frequent as six in an hour, regardless of their intensity (may be mild or painful). °· Contractions that start on the top of the uterus and spread down to the lower abdomen and   back.   °· A sense of increased pelvic pressure.   °· A watery or bloody mucus discharge that comes from the vagina.   °TREATMENT °Depending on the length of the pregnancy and other circumstances, your health care provider may suggest bed rest. If necessary, there are medicines that can be given to stop contractions and to mature the fetal lungs. If labor happens before 34 weeks of pregnancy, a prolonged hospital stay may be recommended. Treatment depends on the condition of both you and the fetus.  °WHAT SHOULD YOU DO IF YOU THINK YOU ARE IN PRETERM LABOR? °Call your health care provider right away. You will need to go to the hospital to get checked immediately. °HOW CAN YOU PREVENT PRETERM LABOR IN FUTURE PREGNANCIES? °You should:  °· Stop smoking if you smoke.  °· Maintain  healthy weight gain and avoid chemicals and drugs that are not necessary. °· Be watchful for any type of infection. °· Inform your health care provider if you have a known history of preterm labor. °Document Released: 07/24/2003 Document Revised: 01/03/2013 Document Reviewed: 06/05/2012 °[employer]® Patient Information ©2014 [employer]. ° °

## 2013-09-08 NOTE — MAU Provider Note (Signed)
History     CSN: [id number]  Arrival date and time: 09/08/13 2024   None     Chief Complaint  Patient presents with  . Abdominal Cramping   HPI Comments: Pt is a G2P0 at 10040w4d arrives to MAU unannounced w c/o cramping/ctx. Denies any VB, discharge or LOF, +FM.   Abdominal Cramping      Past Medical History  Diagnosis Date  . Shortness of breath     since having bronchitis May 2014  . Anxiety   . Depression   . Heart murmur     Past Surgical History  Procedure Laterality Date  . Thumb surgery  2010    Family History  Problem Relation Age of Onset  . Hypertension Paternal Grandmother     History  Substance Use Topics  . Smoking status: Never Smoker   . Smokeless tobacco: Never Used  . Alcohol Use: No     Comment: occasional    Allergies:  Allergies  Allergen Reactions  . Latex Other (See Comments)    Latex condoms cause yeast infection.    Prescriptions prior to admission  Medication Sig Dispense Refill  . acetaminophen (TYLENOL) 500 MG tablet Take 1,000 mg by mouth every 6 (six) hours as needed for mild pain or headache.      . Prenatal Vit-Fe Fumarate-FA (PRENATAL MULTIVITAMIN) TABS tablet Take 1 tablet by mouth at bedtime.       Marland Kitchen. albuterol (PROVENTIL HFA;VENTOLIN HFA) 108 (90 BASE) MCG/ACT inhaler Inhale 2 puffs into the lungs every 6 (six) hours as needed for wheezing or shortness of breath.        Review of Systems  Genitourinary: Positive for urgency.  All other systems reviewed and are negative.  Physical Exam   Blood pressure 114/62, pulse 89, temperature 98 F (36.7 C), temperature source Oral, resp. rate 18, last menstrual period 01/16/2013.  Physical Exam  Nursing note and vitals reviewed. Constitutional: She is oriented to person, place, and time. She appears well-developed and well-nourished.  Cardiovascular: Normal rate.   Respiratory: Effort normal.  GI: Soft.  Genitourinary: Vagina normal.  Spec exam, WNL VE=cl/th/high,  posterior, firm   Musculoskeletal: Normal range of motion.  Neurological: She is alert and oriented to person, place, and time. She has normal reflexes.  Skin: Skin is warm and dry.  Psychiatric: She has a normal mood and affect. Her behavior is normal.   Results for orders placed during the hospital encounter of 09/08/13 (from the past 24 hour(s))  URINALYSIS, ROUTINE W REFLEX MICROSCOPIC     Status: Abnormal   Collection Time    09/08/13  8:40 PM      Result Value Ref Range   Color, Urine YELLOW  YELLOW   APPearance CLEAR  CLEAR   Specific Gravity, Urine 1.025  1.005 - 1.030   pH 6.5  5.0 - 8.0   Glucose, UA NEGATIVE  NEGATIVE mg/dL   Hgb urine dipstick NEGATIVE  NEGATIVE   Bilirubin Urine NEGATIVE  NEGATIVE   Ketones, ur NEGATIVE  NEGATIVE mg/dL   Protein, ur 30 (*) NEGATIVE mg/dL   Urobilinogen, UA 0.2  0.0 - 1.0 mg/dL   Nitrite NEGATIVE  NEGATIVE   Leukocytes, UA NEGATIVE  NEGATIVE  URINE MICROSCOPIC-ADD ON     Status: Abnormal   Collection Time    09/08/13  8:40 PM      Result Value Ref Range   Squamous Epithelial / LPF RARE  RARE   WBC, UA 0-2  <3  WBC/hpf   RBC / HPF 0-2  <3 RBC/hpf   Bacteria, UA RARE  RARE   Crystals CA OXALATE CRYSTALS (*) NEGATIVE    MAU Course  Procedures    Assessment and Plan  IUP at 7782w4d Preterm ctx FHR cat 1  Wet prep sent GC/CT sent FFN sent GBS sent rcv'd PO procardia 1 dose UA relatively neg Await results, recheck cervix in 2hrs, repeat PO procardia    Howell PringleShelley M Sherma Vanmetre 09/08/2013, 10:34 PM   Addendum at 1145pm  S: feels less ctx  O: FFN and WP neg Cervix unchanged FHR cat 1 toco rare  A: IUP at 2482w4d Preterm ctx   P: d/c home, w rx for procardia PRN F/u office routine rv'd PTL sx's and [facility]KC D/w Dr Budd Palmeroberts  S.Leeann MustLillard, CNM

## 2013-09-08 NOTE — MAU Note (Signed)
Pt presents complaining of cramping and feeling faint this evening at work. Still reports the cramping sensation. Denies vaginal bleeding or discharge. Reports good fetal movement.

## 2013-09-09 NOTE — MAU Note (Signed)
Written and verbal d/c instructions given and understanding voiced. 

## 2013-09-10 LAB — CULTURE, BETA STREP (GROUP B ONLY)

## 2013-09-11 LAB — GC/CHLAMYDIA PROBE AMP
CT PROBE, AMP APTIMA: NEGATIVE
GC PROBE AMP APTIMA: NEGATIVE

## 2013-10-12 ENCOUNTER — Emergency Department (HOSPITAL_COMMUNITY)
Admission: EM | Admit: 2013-10-12 | Discharge: 2013-10-12 | Disposition: A | Discharge status: Home / Self Care | Payer: Medicaid Other | Attending: Emergency Medicine | Admitting: Emergency Medicine

## 2013-10-12 ENCOUNTER — Encounter (HOSPITAL_COMMUNITY): Payer: Self-pay | Admitting: Emergency Medicine

## 2013-10-12 DIAGNOSIS — K1379 Other lesions of oral mucosa: Secondary | ICD-10-CM

## 2013-10-12 DIAGNOSIS — K1370 Unspecified lesions of oral mucosa: Secondary | ICD-10-CM | POA: Insufficient documentation

## 2013-10-12 DIAGNOSIS — Z8659 Personal history of other mental and behavioral disorders: Secondary | ICD-10-CM | POA: Insufficient documentation

## 2013-10-12 DIAGNOSIS — Z91040 Latex allergy status: Secondary | ICD-10-CM | POA: Insufficient documentation

## 2013-10-12 DIAGNOSIS — R011 Cardiac murmur, unspecified: Secondary | ICD-10-CM | POA: Insufficient documentation

## 2013-10-12 NOTE — Discharge Instructions (Signed)
Use warm salt water gargles 3-4 times a day to help with pain and swelling.  If symptoms worsening, follow up with your family doctor for recheck of symptoms.  If you develop fever, increased pain, nausea or vomiting seek medical attention for further treaterment.

## 2013-10-12 NOTE — ED Notes (Addendum)
Pt reports mouth pain, left lower pain to tooth since yesterday. Pt [redacted] weeks pregnant, dentist did not want to see pt due to pregnancy, but wanted pt to be followed up to r/o bacterial infection. Saw OB Thursday, no dilation, no pregnancy symptoms.

## 2013-10-12 NOTE — ED Provider Notes (Signed)
CSN: [id number]     Arrival date & time 10/12/13  1005 History   First MD Initiated Contact with Patient 10/12/13 1015    This chart was scribed for non-physician practitioner, Junius FinnerErin O'Malley, working with No att. providers found by Marica OtterNusrat Rahman, ED Scribe. This patient was seen in room TR05C/TR05C and the patient's care was started at 4:43 PM.  Chief Complaint  Patient presents with  . Dental Pain   The history is provided by the patient. No language interpreter was used.   HPI Comments: Domingo Cockingytanisha M Huizar is a 7338 weeks pregnant 23 y.o. female, with a history of SOB, anxiety, depression, and heart murmur, who presents to the Emergency Department complaining of dental pain onset yesterday. Specifically, pt reports pain in the left, lower part of her mouth that started yesterday, worse with eating. Pt denies fever, nausea or vomiting. Pt reports she attempted to see her dentist, however, they did not want to see her due to pregnancy. Pt also reports that she saw her OB yesterday and everything is well with her pregnancy.  She has not tried anything for pain.   Past Medical History  Diagnosis Date  . Shortness of breath     since having bronchitis May 2014  . Anxiety   . Depression   . Heart murmur    Past Surgical History  Procedure Laterality Date  . Thumb surgery  2010   Family History  Problem Relation Age of Onset  . Hypertension Paternal Grandmother    History  Substance Use Topics  . Smoking status: Never Smoker   . Smokeless tobacco: Never Used  . Alcohol Use: No     Comment: occasional   OB History   Grav Para Term Preterm Abortions TAB SAB Ect Mult Living   2    1  1    0     Review of Systems  Constitutional: Negative for fever.  HENT: Positive for dental problem (left, lower mouth pain).   Gastrointestinal: Negative for nausea and vomiting.  All other systems reviewed and are negative.     Allergies  Latex  Home Medications   Prior to Admission  medications   Medication Sig Start Date End Date Taking? Authorizing Provider  acetaminophen (TYLENOL) 500 MG tablet Take 1,000 mg by mouth every 6 (six) hours as needed for mild pain or headache.   Yes Historical Provider, MD  albuterol (PROVENTIL HFA;VENTOLIN HFA) 108 (90 BASE) MCG/ACT inhaler Inhale 2 puffs into the lungs every 6 (six) hours as needed for wheezing or shortness of breath.   Yes Historical Provider, MD  NIFEdipine (PROCARDIA) 10 MG capsule Take 1 capsule (10 mg total) by mouth every 6 (six) hours as needed (contractions). 09/08/13  Yes Malissa HippoShelley M Lillard, CNM  Prenatal Vit-Fe Fumarate-FA (PRENATAL MULTIVITAMIN) TABS tablet Take 1 tablet by mouth at bedtime.    Yes Historical Provider, MD   Triage Vitals: BP 137/79  Pulse 97  Temp(Src) 98.7 F (37.1 C) (Oral)  Resp 18  SpO2 99%  LMP 01/16/2013 Physical Exam  Nursing note and vitals reviewed. Constitutional: She is oriented to person, place, and time. She appears well-developed and well-nourished.  HENT:  Head: Normocephalic and atraumatic.  Mouth/Throat:    Eyes: EOM are normal.  Neck: Normal range of motion.  Cardiovascular: Normal rate.   Pulmonary/Chest: Effort normal.  Musculoskeletal: Normal range of motion.  Neurological: She is alert and oriented to person, place, and time.  Skin: Skin is warm and dry.  Psychiatric:  She has a normal mood and affect. Her behavior is normal.    ED Course  Procedures (including critical care time) DIAGNOSTIC STUDIES: Oxygen Saturation is 99% on RA, normal by my interpretation.    COORDINATION OF CARE: 10:56 AM-Discussed treatment plan which includes salt water gurgles and tylenol with pt at bedside and pt agreed to plan.   Labs Review Labs Reviewed - No data to display  Imaging Review No results found.   EKG Interpretation None      MDM   Final diagnoses:  Mouth sore    Pt presenting with mouth sore without evidence of underlying abscess. Discussed use of  warm salt water gargle with acetaminophen as needed for pain.  Advised to f/u with PCP if not improving. Return precautions provided. Pt verbalized understanding and agreement with tx plan.   I personally performed the services described in this documentation, which was scribed in my presence. The recorded information has been reviewed and is accurate.    Junius Finner, PA-C 10/12/13 1645

## 2013-10-15 NOTE — ED Provider Notes (Signed)
History/physical exam/procedure(s) were performed by non-physician practitioner and as supervising physician I was immediately available for consultation/collaboration. I have reviewed all notes and am in agreement with care and plan.   Daris Harkins S Melba Araki, MD 10/15/13 0752 

## 2013-10-18 ENCOUNTER — Inpatient Hospital Stay (HOSPITAL_COMMUNITY)
Admission: AD | Admit: 2013-10-18 | Discharge: 2013-10-18 | Disposition: A | Discharge status: Home / Self Care | Payer: Medicaid Other | Source: Ambulatory Visit | Attending: Obstetrics and Gynecology | Admitting: Obstetrics and Gynecology

## 2013-10-18 ENCOUNTER — Encounter (HOSPITAL_COMMUNITY): Payer: Self-pay | Admitting: *Deleted

## 2013-10-18 DIAGNOSIS — F411 Generalized anxiety disorder: Secondary | ICD-10-CM | POA: Diagnosis not present

## 2013-10-18 DIAGNOSIS — F329 Major depressive disorder, single episode, unspecified: Secondary | ICD-10-CM | POA: Diagnosis not present

## 2013-10-18 DIAGNOSIS — K047 Periapical abscess without sinus: Secondary | ICD-10-CM | POA: Insufficient documentation

## 2013-10-18 DIAGNOSIS — F32A Depression, unspecified: Secondary | ICD-10-CM | POA: Diagnosis not present

## 2013-10-18 DIAGNOSIS — Z91040 Latex allergy status: Secondary | ICD-10-CM | POA: Diagnosis not present

## 2013-10-18 DIAGNOSIS — O99891 Other specified diseases and conditions complicating pregnancy: Secondary | ICD-10-CM | POA: Insufficient documentation

## 2013-10-18 DIAGNOSIS — R42 Dizziness and giddiness: Secondary | ICD-10-CM | POA: Diagnosis present

## 2013-10-18 DIAGNOSIS — O9989 Other specified diseases and conditions complicating pregnancy, childbirth and the puerperium: Principal | ICD-10-CM

## 2013-10-18 LAB — URINALYSIS, ROUTINE W REFLEX MICROSCOPIC
BILIRUBIN URINE: NEGATIVE
GLUCOSE, UA: NEGATIVE mg/dL
Ketones, ur: NEGATIVE mg/dL
Leukocytes, UA: NEGATIVE
Nitrite: NEGATIVE
PH: 6 (ref 5.0–8.0)
Protein, ur: NEGATIVE mg/dL
Urobilinogen, UA: 0.2 mg/dL (ref 0.0–1.0)

## 2013-10-18 LAB — URINE MICROSCOPIC-ADD ON

## 2013-10-18 MED ORDER — HYDROCODONE-ACETAMINOPHEN 5-325 MG PO TABS
1.0000 | ORAL_TABLET | ORAL | Status: AC
  Administered 2013-10-18: 1 via ORAL
  Filled 2013-10-18: qty 1

## 2013-10-18 MED ORDER — HYDROCODONE-ACETAMINOPHEN 5-325 MG PO TABS: 1.0000 | ORAL_TABLET | ORAL | Status: DC | PRN

## 2013-10-18 NOTE — Discharge Instructions (Signed)
Dizziness Dizziness is a common problem. It is a feeling of unsteadiness or lightheadedness. You may feel like you are about to faint. Dizziness can lead to injury if you stumble or fall. A person of any age group can suffer from dizziness, but dizziness is more common in older adults. CAUSES  Dizziness can be caused by many different things, including:  Middle ear problems.  Standing for too long.  Infections.  An allergic reaction.  Aging.  An emotional response to something, such as the sight of blood.  Side effects of medicines.  Fatigue.  Problems with circulation or blood pressure.  Excess use of alcohol, medicines, or illegal drug use.  Breathing too fast (hyperventilation).  An arrhythmia or problems with your heart rhythm.  Low red blood cell count (anemia).  Pregnancy.  Vomiting, diarrhea, fever, or other illnesses that cause dehydration.  Diseases or conditions such as Parkinson's disease, high blood pressure (hypertension), diabetes, and thyroid problems.  Exposure to extreme heat. DIAGNOSIS  To find the cause of your dizziness, your caregiver may do a physical exam, lab tests, radiologic imaging scans, or an electrocardiography test (ECG).  TREATMENT  Treatment of dizziness depends on the cause of your symptoms and can vary greatly. HOME CARE INSTRUCTIONS   Drink enough fluids to keep your urine clear or pale yellow. This is especially important in very hot weather. In the elderly, it is also important in cold weather.  If your dizziness is caused by medicines, take them exactly as directed. When taking blood pressure medicines, it is especially important to get up slowly.  Rise slowly from chairs and steady yourself until you feel okay.  In the morning, first sit up on the side of the bed. When this seems okay, stand slowly while holding onto something until you know your balance is fine.  If you need to stand in one place for a long time, be sure to  move your legs often. Tighten and relax the muscles in your legs while standing.  If dizziness continues to be a problem, have someone stay with you for a day or two. Do this until you feel you are well enough to stay alone. Have the person call your caregiver if he or she notices changes in you that are concerning.  Do not drive or use heavy machinery if you feel dizzy.  Do not drink alcohol. SEEK IMMEDIATE MEDICAL CARE IF:   Your dizziness or lightheadedness gets worse.  You feel nauseous or vomit.  You develop problems with talking, walking, weakness, or using your arms, hands, or legs.  You are not thinking clearly or you have difficulty forming sentences. It may take a friend or family member to determine if your thinking is normal.  You develop chest pain, abdominal pain, shortness of breath, or sweating.  Your vision changes.  You notice any bleeding.  You have side effects from medicine that seems to be getting worse rather than better. MAKE SURE YOU:   Understand these instructions.  Will watch your condition.  Will get help right away if you are not doing well or get worse. Document Released: 10/27/2000 Document Revised: 07/26/2011 Document Reviewed: 11/20/2010 [facility] Patient Information 2014 [place], [place].  Dental Abscess A dental abscess is a collection of infected fluid (pus) from a bacterial infection in the inner part of the tooth (pulp). It usually occurs at the end of the tooth's root.  CAUSES   Severe tooth decay.  Trauma to the tooth that allows bacteria  to enter into the pulp, such as a broken or chipped tooth. SYMPTOMS   Severe pain in and around the infected tooth.  Swelling and redness around the abscessed tooth or in the mouth or face.  Tenderness.  Pus drainage.  Bad breath.  Bitter taste in the mouth.  Difficulty swallowing.  Difficulty opening the mouth.  Nausea.  Vomiting.  Chills.  Swollen neck glands. DIAGNOSIS   A  medical and dental history will be taken.  An examination will be performed by tapping on the abscessed tooth.  X-rays may be taken of the tooth to identify the abscess. TREATMENT The goal of treatment is to eliminate the infection. You may be prescribed antibiotic medicine to stop the infection from spreading. A root canal may be performed to save the tooth. If the tooth cannot be saved, it may be pulled (extracted) and the abscess may be drained.  HOME CARE INSTRUCTIONS  Only take over-the-counter or prescription medicines for pain, fever, or discomfort as directed by your caregiver.  Rinse your mouth (gargle) often with salt water ( tsp salt in 8 oz [250 ml] of warm water) to relieve pain or swelling.  Do not drive after taking pain medicine (narcotics).  Do not apply heat to the outside of your face.  Return to your dentist for further treatment as directed. SEEK MEDICAL CARE IF:  Your pain is not helped by medicine.  Your pain is getting worse instead of better. SEEK IMMEDIATE MEDICAL CARE IF:  You have a fever or persistent symptoms for more than 2 3 days.  You have a fever and your symptoms suddenly get worse.  You have chills or a very bad headache.  You have problems breathing or swallowing.  You have trouble opening your mouth.  You have swelling in the neck or around the eye. Document Released: 05/03/2005 Document Revised: 01/26/2012 Document Reviewed: 08/11/2010 [facility] Patient Information 2014 [place], [place].

## 2013-10-18 NOTE — MAU Note (Addendum)
Pt was at the doctors office and she felt lighthead and dizzy. Sent from office to MAU for further eval. C/o mild headache and some cramping.

## 2013-10-27 ENCOUNTER — Inpatient Hospital Stay (HOSPITAL_COMMUNITY)
Admission: AD | Admit: 2013-10-27 | Discharge: 2013-10-27 | Disposition: A | Discharge status: Home / Self Care | Payer: Medicaid Other | Source: Ambulatory Visit | Attending: Obstetrics and Gynecology | Admitting: Obstetrics and Gynecology

## 2013-10-27 ENCOUNTER — Encounter (HOSPITAL_COMMUNITY): Payer: Self-pay

## 2013-10-27 DIAGNOSIS — F329 Major depressive disorder, single episode, unspecified: Secondary | ICD-10-CM | POA: Diagnosis not present

## 2013-10-27 DIAGNOSIS — Z91040 Latex allergy status: Secondary | ICD-10-CM | POA: Diagnosis not present

## 2013-10-27 DIAGNOSIS — F32A Depression, unspecified: Secondary | ICD-10-CM | POA: Diagnosis not present

## 2013-10-27 DIAGNOSIS — F411 Generalized anxiety disorder: Secondary | ICD-10-CM | POA: Diagnosis not present

## 2013-10-27 DIAGNOSIS — R42 Dizziness and giddiness: Secondary | ICD-10-CM | POA: Diagnosis present

## 2013-10-27 DIAGNOSIS — O479 False labor, unspecified: Secondary | ICD-10-CM | POA: Insufficient documentation

## 2013-10-27 DIAGNOSIS — F3289 Other specified depressive episodes: Secondary | ICD-10-CM | POA: Insufficient documentation

## 2013-10-27 DIAGNOSIS — O99340 Other mental disorders complicating pregnancy, unspecified trimester: Secondary | ICD-10-CM | POA: Insufficient documentation

## 2013-10-27 HISTORY — DX: Anemia, unspecified: D64.9

## 2013-10-27 LAB — CBC WITH DIFFERENTIAL/PLATELET
BASOS ABS: 0 10*3/uL (ref 0.0–0.1)
Basophils Relative: 1 % (ref 0–1)
EOS PCT: 1 % (ref 0–5)
Eosinophils Absolute: 0.1 10*3/uL (ref 0.0–0.7)
HCT: 34.3 % — ABNORMAL LOW (ref 36.0–46.0)
Hemoglobin: 11.5 g/dL — ABNORMAL LOW (ref 12.0–15.0)
Lymphocytes Relative: 24 % (ref 12–46)
Lymphs Abs: 1.6 10*3/uL (ref 0.7–4.0)
MCH: 25.8 pg — AB (ref 26.0–34.0)
MCHC: 33.5 g/dL (ref 30.0–36.0)
MCV: 77.1 fL — AB (ref 78.0–100.0)
MONO ABS: 0.4 10*3/uL (ref 0.1–1.0)
Monocytes Relative: 7 % (ref 3–12)
Neutro Abs: 4.5 10*3/uL (ref 1.7–7.7)
Neutrophils Relative %: 67 % (ref 43–77)
PLATELETS: 190 10*3/uL (ref 150–400)
RBC: 4.45 MIL/uL (ref 3.87–5.11)
RDW: 14.7 % (ref 11.5–15.5)
WBC: 6.7 10*3/uL (ref 4.0–10.5)

## 2013-10-27 LAB — COMPREHENSIVE METABOLIC PANEL
ALT: 12 U/L (ref 0–35)
AST: 18 U/L (ref 0–37)
Albumin: 2.5 g/dL — ABNORMAL LOW (ref 3.5–5.2)
Alkaline Phosphatase: 153 U/L — ABNORMAL HIGH (ref 39–117)
BUN: 6 mg/dL (ref 6–23)
CO2: 21 mEq/L (ref 19–32)
CREATININE: 0.6 mg/dL (ref 0.50–1.10)
Calcium: 9.5 mg/dL (ref 8.4–10.5)
Chloride: 102 mEq/L (ref 96–112)
GFR calc non Af Amer: >90 mL/min (ref >90)
Glucose, Bld: 83 mg/dL (ref 70–99)
Potassium: 3.9 mEq/L (ref 3.7–5.3)
SODIUM: 135 meq/L — AB (ref 137–147)
TOTAL PROTEIN: 6.4 g/dL (ref 6.0–8.3)
Total Bilirubin: 0.6 mg/dL (ref 0.3–1.2)

## 2013-10-27 LAB — URINALYSIS, ROUTINE W REFLEX MICROSCOPIC
Bilirubin Urine: NEGATIVE
Glucose, UA: NEGATIVE mg/dL
Hgb urine dipstick: NEGATIVE
Ketones, ur: NEGATIVE mg/dL
Nitrite: NEGATIVE
PH: 6 (ref 5.0–8.0)
Protein, ur: NEGATIVE mg/dL
SPECIFIC GRAVITY, URINE: 1.005 (ref 1.005–1.030)
UROBILINOGEN UA: 0.2 mg/dL (ref 0.0–1.0)

## 2013-10-27 LAB — URINE MICROSCOPIC-ADD ON

## 2013-10-27 LAB — TSH: TSH: 3.33 u[IU]/mL (ref 0.350–4.500)

## 2013-10-27 MED ORDER — LACTATED RINGERS IV BOLUS (SEPSIS)
500.0000 mL | Freq: Once | INTRAVENOUS | Status: AC
  Administered 2013-10-27: 1000 mL via INTRAVENOUS

## 2013-10-27 MED ORDER — MECLIZINE HCL 50 MG PO TABS: 50.0000 mg | ORAL_TABLET | Freq: Three times a day (TID) | ORAL | Status: DC | PRN

## 2013-10-27 MED ORDER — MECLIZINE HCL 25 MG PO TABS
50.0000 mg | ORAL_TABLET | ORAL | Status: AC
  Administered 2013-10-27: 50 mg via ORAL
  Filled 2013-10-27: qty 2

## 2013-10-27 MED ORDER — LACTATED RINGERS IV SOLN
INTRAVENOUS | Status: DC
  Administered 2013-10-27: 06:00:00 via INTRAVENOUS

## 2013-10-27 NOTE — MAU Provider Note (Signed)
History   23 yo G2P0010 at 40 4/7 weeks presented unannounced c/o dizziness and contractions since early evening.  Has been seen for dizziness in MAU on 6/4--patient reports issue has been present since then.  Not sure how frequently UCs are occurring, but they have become stronger during the night.  Denies N/V, chest pain, HA, visual sx, epigastric pain, leaking, or bleeding.  Reports +FM.  Cervix was 1.5 in office this week per patient's report, and membranes were swept.  Patient scheduled for induction on 6/16 at 7:30p.  Denies that any activities worsen the dizziness.  Has not fallen, no issues with gait.  No slurring of speech or memory issues.  Seen in MAU in January for reported syncopal episode--had similar episode in 2011, with full cardiac w/u all WNL.  Reports hx of anxiety and depression, but no current or recent meds.  Patient Active Problem List   Diagnosis Date Noted  . Generalized anxiety disorder 10/27/2013  . Depression 10/27/2013  . Dizziness 10/27/2013  . 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 Anxiety state, unspecified 10/27/2013  . Syncopal episodes 05/27/2013    History of present pregnancy: Patient entered care at 19 1/7 weeks.   EDC of 10/23/13 was established by LMP and was congruent with 19 week US.   Anatomy scan:  19 1/7 weeks, with normal findings and an anterior placenta.   Additional US evaluations:   35 1/7 weeks for S<D:  EFW 5+3. 41%ile, vtx, normal fluid   Significant prenatal events:  Entered care late at 19 weeks.  Seen in January for syncopal episode, with hx of same in 2011, with full cardiac w/u WNL.  Dx was likely vasovagal response.  15K Enterococcus on [facility] urine culture, but negative cultures in f/u.  Quad screen WNL.  TSH 0.848 in January.  Seen several times in MAU between 30 and 33 weeks for cramping--FFN negative, treated with Procardia. Dx 5/29 in ER with tooth abscess--placed on ATB at that time, has now completed course.  Sx have resolved.  Received TDAP  05/30/13. Last evaluation:  10/25/13 at [facility], with cervix 1 cm, 30%, vtx, -3, with membranes swept.  Scheduled for induction on 10/30/13 at 7:30pm.  Chief Complaint  Patient presents with  . Contractions  . Dizziness   HPI:  See above  OB History   Grav Para Term Preterm Abortions TAB SAB Ect Mult Living   2    1  1    0    2014--SAB at 4 weeks  Past Medical History  Diagnosis Date  . Shortness of breath     since having bronchitis May 2014  . Anxiety   . Depression   . Heart murmur   . Anemia     Past Surgical History  Procedure Laterality Date  . Thumb surgery  2010  . Right thumb      for fracture    Family History  Problem Relation Age of Onset  . Hypertension Paternal Grandmother     History  Substance Use Topics  . Smoking status: Never Smoker   . Smokeless tobacco: Never Used  . Alcohol Use: No     Comment: occasional    Allergies:  Allergies  Allergen Reactions  . Latex Other (See Comments)    Latex condoms cause yeast infection.    Prescriptions prior to admission  Medication Sig Dispense Refill  . acetaminophen (TYLENOL) 500 MG tablet Take 1,000 mg by mouth every 6 (six) hours as needed for mild pain or  headache.      Marland Kitchen. HYDROcodone-acetaminophen (NORCO/VICODIN) 5-325 MG per tablet Take 1 tablet by mouth every 4 (four) hours as needed for moderate pain.  30 tablet  0  . Prenatal Vit-Fe Fumarate-FA (PRENATAL MULTIVITAMIN) TABS tablet Take 1 tablet by mouth at bedtime.       Marland Kitchen. albuterol (PROVENTIL HFA;VENTOLIN HFA) 108 (90 BASE) MCG/ACT inhaler Inhale 2 puffs into the lungs every 6 (six) hours as needed for wheezing or shortness of breath.      Marland Kitchen. amoxicillin (AMOXIL) 500 MG tablet Take 500 mg by mouth every 8 (eight) hours.        ROS:  Dizziness, contractions, +FM.   Prenatal Transfer Tool  Maternal Diabetes: No Genetic Screening: Normal Quad screen Maternal Ultrasounds/Referrals: Normal Fetal Ultrasounds or other Referrals:  None Maternal  Substance Abuse:  No Significant Maternal Medications:  None Significant Maternal Lab Results: None  Prenatal labs:  ABO, Rh: B+  Antibody: Neg  Rubella: Immune  RPR: NR  HBsAg: Neg  HIV: NR  GBS: Negative  Sickle cell/Hgb electrophoresis: AA  Pap: 11/2012 WNL  GC: Negative at [facility]B, and in March and April [facility], and in March and April Genetic screenings: Negative Quad screen  Glucola: Other: Hgb 11.3 at [facility], 11.2 at 28 weeks  TSH 0.848 in January 2015 FFN negative at 33 weeks.    Physical Exam   Blood pressure 121/77, pulse 89, temperature 98.6 F (37 C), resp. rate 20, height 5\' 1"  (1.549 m), weight 129 lb 9.6 oz (58.786 kg), last menstrual period 01/16/2013.  Physical Exam Appears in NAD--normal gait and appearance Eyes PERL Throat clear Ears--right ear canal and drum clear.  Left eardrum appears full, with significant cerumen noted. Chest clear Heart RRR without arrythmia--mild systolic murmur, II/IV Abd soft, NT Pelvic--cervix posterior, 1 cm, 75-80%, vtx, -2, cervix still firm. Ext--DTR 1+ without clonus, patellar reflex WNL, gait WNL  FHR Category 1  UCs sporadic, mild.   ED Course  Assessment: IUP at 40 4/7 weeks Dizziness--? Related to fluid in inner ear Mild contractions GBS negative   Plan: IV hydration UA, CBC, diff, CMP, TSH Meclizine 50 mg po Observe at present. Re-evaluate prn. On-coming CNM to complete care.   Nigel BridgemanLATHAM, Mozell Haber CNM, MSN 10/27/2013 5:51 AM

## 2013-10-27 NOTE — MAU Provider Note (Signed)
History   Continuation from VL, CNM: Patient presents with dizziness and contractions since this am.  Reports that dizziness has resolved with meclizine, but contractions remain painful.  However, contractions continue to be erratic and short.  Patient continues to deny LOF, VB, and reports active fetus.  Induction scheduled 6/16.   Patient Active Problem List   Diagnosis Date Noted  . Generalized anxiety disorder 10/27/2013  . Depression 10/27/2013  . Dizziness 10/27/2013  . 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 Anxiety state, unspecified 10/27/2013  . Syncopal episodes 05/27/2013    Chief Complaint  Patient presents with  . Contractions  . Dizziness   HPI  OB History   Grav Para Term Preterm Abortions TAB SAB Ect Mult Living   2    1  1    0      Past Medical History  Diagnosis Date  . Shortness of breath     since having bronchitis May 2014  . Anxiety   . Depression   . Heart murmur   . Anemia     Past Surgical History  Procedure Laterality Date  . Thumb surgery  2010  . Right thumb      for fracture    Family History  Problem Relation Age of Onset  . Hypertension Paternal Grandmother     History  Substance Use Topics  . Smoking status: Never Smoker   . Smokeless tobacco: Never Used  . Alcohol Use: No     Comment: occasional    Allergies:  Allergies  Allergen Reactions  . Latex Other (See Comments)    Latex condoms cause yeast infection.    Prescriptions prior to admission  Medication Sig Dispense Refill  . acetaminophen (TYLENOL) 500 MG tablet Take 1,000 mg by mouth every 6 (six) hours as needed for mild pain or headache.      Marland Kitchen. HYDROcodone-acetaminophen (NORCO/VICODIN) 5-325 MG per tablet Take 1 tablet by mouth every 4 (four) hours as needed for moderate pain.  30 tablet  0  . Prenatal Vit-Fe Fumarate-FA (PRENATAL MULTIVITAMIN) TABS tablet Take 1 tablet by mouth at bedtime.       Marland Kitchen. albuterol (PROVENTIL HFA;VENTOLIN HFA) 108 (90 BASE) MCG/ACT inhaler  Inhale 2 puffs into the lungs every 6 (six) hours as needed for wheezing or shortness of breath.      Marland Kitchen. amoxicillin (AMOXIL) 500 MG tablet Take 500 mg by mouth every 8 (eight) hours.        ROS Physical Exam   Blood pressure 116/66, pulse 70, temperature 98.4 F (36.9 C), temperature source Oral, resp. rate 18, height 5\' 1"  (1.549 m), weight 129 lb 9.6 oz (58.786 kg), last menstrual period 01/16/2013.  Physical Exam Per VL, CNM Physical Exam  Appears in NAD--normal gait and appearance  Eyes PERL  Throat clear  Ears--right ear canal and drum clear. Left eardrum appears full, with significant cerumen noted.  Chest clear  Heart RRR without arrythmia--mild systolic murmur, II/IV  Abd soft, NT  Pelvic--cervix posterior, 1 cm, 75-80%, vtx, -2, cervix still firm.  Ext--DTR 1+ without clonus, patellar reflex WNL, gait WNL   FHR 120 bpm, Mod Var, -Decels, +Accels  UC: Irregular, sporadic, mild to palpation  ED Course  Assessment: IUP at 40.4wks Dizziness Early Labor Cat 1 FT  Plan: Reports feeling better since antivert dosage, rx sent to pharmacy Labor precautions and fetal movement discussed F/U in office as scheduled IOL scheduled for 6/16 at 7pm Discharge to home Call if you have any questions  or concerns prior to your next visit.   Macyn Shropshire LYNN CNM, MSN 10/27/2013 7:40 AM

## 2013-10-27 NOTE — MAU Note (Signed)
Contractions all day. Dizziness

## 2013-10-27 NOTE — Discharge Instructions (Signed)
Meclizine tablets or capsules What is this medicine? MECLIZINE (MEK li zeen) is an antihistamine. It is used to prevent nausea, vomiting, or dizziness caused by motion sickness. It is also used to prevent and treat vertigo (extreme dizziness or a feeling that you or your surroundings are tilting or spinning around). This medicine may be used for other purposes; ask your health care provider or pharmacist if you have questions. COMMON BRAND NAME(S): Antivert, Dramamine Less Drowsy, Medivert, Meni-D  What should I tell my health care provider before I take this medicine? They need to know if you have any of these conditions: -asthma -glaucoma -prostate trouble -stomach problems -urinary problems -an unusual or allergic reaction to meclizine, other medicines, foods, dyes, or preservatives -pregnant or trying to get pregnant -breast-feeding How should I use this medicine? Take this medicine by mouth with a glass of water. Follow the directions on the prescription label. If you are using this medicine to prevent motion sickness, take the dose at least 1 hour before travel. If it upsets your stomach, take it with food or milk. Take your doses at regular intervals. Do not take your medicine more often than directed. Talk to your pediatrician regarding the use of this medicine in children. Special care may be needed. Overdosage: If you think you have taken too much of this medicine contact a poison control center or emergency room at once. NOTE: This medicine is only for you. Do not share this medicine with others. What if I miss a dose? If you miss a dose, take it as soon as you can. If it is almost time for your next dose, take only that dose. Do not take double or extra doses. What may interact with this medicine? -barbiturate medicines for inducing sleep or treating seizures -digoxin -medicines for anxiety or sleeping problems, like alprazolam, diazepam or temazepam -medicines for hay fever and  other allergies -medicines for mental depression -medicines for movement abnormalities as in Parkinson's disease, or for stomach problems -medicines for pain -medicines that relax muscles This list may not describe all possible interactions. Give your health care provider a list of all the medicines, herbs, non-prescription drugs, or dietary supplements you use. Also tell them if you smoke, drink alcohol, or use illegal drugs. Some items may interact with your medicine. What should I watch for while using this medicine? If you are taking this medicine on a regular schedule, visit your doctor or health care professional for regular checks on your progress. You may get dizzy, drowsy or have blurred vision. Do not drive, use machinery, or do anything that needs mental alertness until you know how this medicine affects you. Do not stand or sit up quickly, especially if you are an older patient. This reduces the risk of dizzy or fainting spells. Alcohol can increase possible dizziness. Avoid alcoholic drinks. Your mouth may get dry. Chewing sugarless gum or sucking hard candy, and drinking plenty of water may help. Contact your doctor if the problem does not go away or is severe. This medicine may cause dry eyes and blurred vision. If you wear contact lenses you may feel some discomfort. Lubricating drops may help. See your eye doctor if the problem does not go away or is severe. What side effects may I notice from receiving this medicine? Side effects that you should report to your doctor or health care professional as soon as possible: -fainting spells -fast or irregular heartbeat Side effects that usually do not require medical attention (report to  your doctor or health care professional if they continue or are bothersome): -constipation -difficulty passing urine -difficulty sleeping -headache -stomach upset This list may not describe all possible side effects. Call your doctor for medical advice  about side effects. You may report side effects to FDA at 1-800-FDA-1088. Where should I keep my medicine? Keep out of the reach of children. Store at room temperature between 15 and 30 degrees C (59 and 86 degrees F). Keep container tightly closed. Throw away any unused medicine after the expiration date. NOTE: This sheet is a summary. It may not cover all possible information. If you have questions about this medicine, talk to your doctor, pharmacist, or health care provider.  2014, Elsevier/Gold Standard. (2007-11-09 10:35:36) Dizziness  Dizziness means you feel unsteady or lightheaded. You might feel like you are going to pass out (faint). HOME CARE   Drink enough fluids to keep your pee (urine) clear or pale yellow.  Take your medicines exactly as told by your doctor. If you take blood pressure medicine, always stand up slowly from the lying or sitting position. Hold on to something to steady yourself.  If you need to stand in one place for a long time, move your legs often. Tighten and relax your leg muscles.  Have someone stay with you until you feel okay.  Do not drive or use heavy machinery if you feel dizzy.  Do not drink alcohol. GET HELP RIGHT AWAY IF:   You feel dizzy or lightheaded and it gets worse.  You feel sick to your stomach (nauseous), or you throw up (vomit).  You have trouble talking or walking.  You feel weak or have trouble using your arms, hands, or legs.  You cannot think clearly or have trouble forming sentences.  You have chest pain, belly (abdominal) pain, sweating, or you are short of breath.  Your vision changes.  You are bleeding.  You have problems from your medicine that seem to be getting worse. MAKE SURE YOU:   Understand these instructions.  Will watch your condition.  Will get help right away if you are not doing well or get worse. Document Released: 04/22/2011 Document Revised: 07/26/2011 Document Reviewed: 04/22/2011 [facility] AssocExitCare  Patient Information 2014 [place], [place].

## 2013-10-27 NOTE — MAU Provider Note (Signed)
History   23 yo G2P0010 at 6539 3/7 weeks presented from office due to dizziness reported there.  Has been on ATB for tooth abscess, thinks dizziness may be related to that.  Denies HA, visual sx, epigastric pain, or any other sx.  Report +FM, denies leaking or bleeding.  Has had hx of reported syncopal episode in January, with Urgent Care and MAU evaluation.  Now reports dizziness improved since arrival.  Patient Active Problem List   Diagnosis Date Noted  . Generalized anxiety disorder 10/27/2013  . Depression 10/27/2013  . Dizziness 10/27/2013  . 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 Syncopal episodes 05/27/2013    Chief Complaint  Patient presents with  . Dizziness   HPI:  See above  OB History   Grav Para Term Preterm Abortions TAB SAB Ect Mult Living   2    1  1    0    2014--SAB, 4 weeks.  Past Medical History  Diagnosis Date  . Shortness of breath     since having bronchitis May 2014  . Anxiety   . Depression   . Heart murmur     Past Surgical History  Procedure Laterality Date  . Thumb surgery  2010    Family History  Problem Relation Age of Onset  . Hypertension Paternal Grandmother     History  Substance Use Topics  . Smoking status: Never Smoker   . Smokeless tobacco: Never Used  . Alcohol Use: No     Comment: occasional    Allergies:  Allergies  Allergen Reactions  . Latex Other (See Comments)    Latex condoms cause yeast infection.    Prescriptions prior to admission  Medication Sig Dispense Refill  . acetaminophen (TYLENOL) 500 MG tablet Take 1,000 mg by mouth every 6 (six) hours as needed for mild pain or headache.      . albuterol (PROVENTIL HFA;VENTOLIN HFA) 108 (90 BASE) MCG/ACT inhaler Inhale 2 puffs into the lungs every 6 (six) hours as needed for wheezing or shortness of breath.      Marland Kitchen. amoxicillin (AMOXIL) 500 MG tablet Take 500 mg by mouth every 8 (eight) hours.      Marland Kitchen. HYDROcodone-acetaminophen (NORCO/VICODIN) 5-325 MG per tablet Take 1  tablet by mouth every 4 (four) hours as needed for moderate pain.  30 tablet  0  . Prenatal Vit-Fe Fumarate-FA (PRENATAL MULTIVITAMIN) TABS tablet Take 1 tablet by mouth at bedtime.         ROS:  Mild dizziness, +FM Physical Exam   Blood pressure 114/62, pulse 83, resp. rate 18, last menstrual period 01/16/2013.  Physical Exam In NAD Chest clear Heart RRR without murmur Abd gravid, NT Pelvic Deferred Ext WNL  FHR Category 1 UCs none  UA WNL.  ED Course  Assessment: IUP at 39 3/7 weeks Dizziness--improved Tooth abscess   Plan: D/C home  Push po fluids Avoid quick position changes Keep scheduled appts or call prn.  Nigel BridgemanLATHAM, Elishah Ashmore CNM, MSN 10/18/13  7p

## 2013-10-28 ENCOUNTER — Encounter (HOSPITAL_COMMUNITY): Payer: Self-pay | Admitting: *Deleted

## 2013-10-28 ENCOUNTER — Inpatient Hospital Stay (HOSPITAL_COMMUNITY)
Admission: AD | Admit: 2013-10-28 | Discharge: 2013-10-28 | Disposition: A | Discharge status: Home / Self Care | Payer: Medicaid Other | Source: Ambulatory Visit | Attending: Obstetrics and Gynecology | Admitting: Obstetrics and Gynecology

## 2013-10-28 DIAGNOSIS — O479 False labor, unspecified: Secondary | ICD-10-CM | POA: Diagnosis present

## 2013-10-28 DIAGNOSIS — Z8249 Family history of ischemic heart disease and other diseases of the circulatory system: Secondary | ICD-10-CM | POA: Diagnosis not present

## 2013-10-28 MED ORDER — NALBUPHINE HCL 10 MG/ML IJ SOLN
10.0000 mg | Freq: Once | INTRAMUSCULAR | Status: AC
Start: 2013-10-28 — End: 2013-10-28
  Administered 2013-10-28: 10 mg via INTRAMUSCULAR
  Filled 2013-10-28: qty 1

## 2013-10-28 MED ORDER — PROMETHAZINE HCL 25 MG/ML IJ SOLN
25.0000 mg | Freq: Once | INTRAMUSCULAR | Status: AC
  Administered 2013-10-28: 25 mg via INTRAMUSCULAR
  Filled 2013-10-28: qty 1

## 2013-10-28 NOTE — MAU Provider Note (Signed)
  History     CSN: [id number]  Arrival date and time: 10/28/13 0047   First Provider Initiated Contact with Patient 10/28/13 0120      Chief Complaint  Patient presents with  . Contractions   HPI Comments: Pt is a G2P0 at 4153w5d arrives unannounced w c/o reg more painful ctx, denies VB or LOF, +FM.      Past Medical History  Diagnosis Date  . Shortness of breath     since having bronchitis May 2014  . Anxiety   . Depression   . Heart murmur   . Anemia     Past Surgical History  Procedure Laterality Date  . Thumb surgery  2010  . Right thumb      for fracture    Family History  Problem Relation Age of Onset  . Hypertension Paternal Grandmother     History  Substance Use Topics  . Smoking status: Never Smoker   . Smokeless tobacco: Never Used  . Alcohol Use: No     Comment: occasional    Allergies:  Allergies  Allergen Reactions  . Latex Other (See Comments)    Latex condoms cause yeast infection.    Prescriptions prior to admission  Medication Sig Dispense Refill  . acetaminophen (TYLENOL) 500 MG tablet Take 1,000 mg by mouth every 6 (six) hours as needed for mild pain or headache.      . albuterol (PROVENTIL HFA;VENTOLIN HFA) 108 (90 BASE) MCG/ACT inhaler Inhale 2 puffs into the lungs every 6 (six) hours as needed for wheezing or shortness of breath.      . Prenatal Vit-Fe Fumarate-FA (PRENATAL MULTIVITAMIN) TABS tablet Take 1 tablet by mouth at bedtime.       Marland Kitchen. HYDROcodone-acetaminophen (NORCO/VICODIN) 5-325 MG per tablet Take 1 tablet by mouth every 4 (four) hours as needed for moderate pain.  30 tablet  0  . meclizine (ANTIVERT) 50 MG tablet Take 1 tablet (50 mg total) by mouth 3 (three) times daily as needed.  30 tablet  0    Review of Systems  All other systems reviewed and are negative.  Physical Exam   Blood pressure 127/83, pulse 100, temperature 98.7 F (37.1 C), resp. rate 22, last menstrual period 01/16/2013.  Physical Exam  Nursing  note and vitals reviewed. Constitutional: She is oriented to person, place, and time. She appears well-developed and well-nourished.  HENT:  Head: Normocephalic.  Eyes: Pupils are equal, round, and reactive to light.  Neck: Normal range of motion.  Cardiovascular: Normal rate.   Respiratory: Effort normal.  GI: Soft.  Genitourinary: Vagina normal.  Musculoskeletal: Normal range of motion.  Neurological: She is alert and oriented to person, place, and time. She has normal reflexes.  Skin: Skin is warm and dry.  Psychiatric: She has a normal mood and affect. Her behavior is normal.    MAU Course  Procedures    Assessment and Plan  IUP at 4553w5d Early vs. Prodrome labor Cervix unchanged after observation, 1/90/-1 FHR cat 1 Ctx, irreg pattern  Will give nubain 10mg /phenergan 25mg  IM x1 Dc home F/u as scheduled w IOL Tuesday or return if ctx more reg, VB or LOF or dec FM.  dc'd home stable condition   Camy Leder M 10/28/2013, 2:20 AM

## 2013-10-28 NOTE — MAU Note (Signed)
Contractions all day. Leaking fld some since this am with mucous d/c as well.

## 2013-10-28 NOTE — Discharge Instructions (Signed)
Fetal Movement Counts Patient Name: __________________________________________________ Patient Due Date: ____________________ Performing a fetal movement count is highly recommended in high-risk pregnancies, but it is good for every pregnant woman to do. Your caregiver may ask you to start counting fetal movements at 28 weeks of the pregnancy. Fetal movements often increase:  After eating a full meal.  After physical activity.  After eating or drinking something sweet or cold.  At rest. Pay attention to when you feel the baby is most active. This will help you notice a pattern of your baby's sleep and wake cycles and what factors contribute to an increase in fetal movement. It is important to perform a fetal movement count at the same time each day when your baby is normally most active.  HOW TO COUNT FETAL MOVEMENTS 1. Find a quiet and comfortable area to sit or lie down on your left side. Lying on your left side provides the best blood and oxygen circulation to your baby. 2. Write down the day and time on a sheet of paper or in a journal. 3. Start counting kicks, flutters, swishes, rolls, or jabs in a 2 hour period. You should feel at least 10 movements within 2 hours. 4. If you do not feel 10 movements in 2 hours, wait 2 3 hours and count again. Look for a change in the pattern or not enough counts in 2 hours. SEEK MEDICAL CARE IF:  You feel less than 10 counts in 2 hours, tried twice.  There is no movement in over an hour.  The pattern is changing or taking longer each day to reach 10 counts in 2 hours.  You feel the baby is not moving as he or she usually does. Date: ____________ Movements: ____________ Start time: ____________ Finish time: ____________  Date: ____________ Movements: ____________ Start time: ____________ Finish time: ____________ Date: ____________ Movements: ____________ Start time: ____________ Finish time: ____________ Date: ____________ Movements: ____________  Start time: ____________ Finish time: ____________ Date: ____________ Movements: ____________ Start time: ____________ Finish time: ____________ Date: ____________ Movements: ____________ Start time: ____________ Finish time: ____________ Date: ____________ Movements: ____________ Start time: ____________ Finish time: ____________ Date: ____________ Movements: ____________ Start time: ____________ Finish time: ____________  Date: ____________ Movements: ____________ Start time: ____________ Finish time: ____________ Date: ____________ Movements: ____________ Start time: ____________ Finish time: ____________ Date: ____________ Movements: ____________ Start time: ____________ Finish time: ____________ Date: ____________ Movements: ____________ Start time: ____________ Finish time: ____________ Date: ____________ Movements: ____________ Start time: ____________ Finish time: ____________ Date: ____________ Movements: ____________ Start time: ____________ Finish time: ____________ Date: ____________ Movements: ____________ Start time: ____________ Finish time: ____________  Date: ____________ Movements: ____________ Start time: ____________ Finish time: ____________ Date: ____________ Movements: ____________ Start time: ____________ Finish time: ____________ Date: ____________ Movements: ____________ Start time: ____________ Finish time: ____________ Date: ____________ Movements: ____________ Start time: ____________ Finish time: ____________ Date: ____________ Movements: ____________ Start time: ____________ Finish time: ____________ Date: ____________ Movements: ____________ Start time: ____________ Finish time: ____________ Date: ____________ Movements: ____________ Start time: ____________ Finish time: ____________  Date: ____________ Movements: ____________ Start time: ____________ Finish time: ____________ Date: ____________ Movements: ____________ Start time: ____________ Finish time:  ____________ Date: ____________ Movements: ____________ Start time: ____________ Finish time: ____________ Date: ____________ Movements: ____________ Start time: ____________ Finish time: ____________ Date: ____________ Movements: ____________ Start time: ____________ Finish time: ____________ Date: ____________ Movements: ____________ Start time: ____________ Finish time: ____________ Date: ____________ Movements: ____________ Start time: ____________ Finish time: ____________  Date: ____________ Movements: ____________ Start time: ____________ Finish   time: ____________ Date: ____________ Movements: ____________ Start time: ____________ Finish time: ____________ Date: ____________ Movements: ____________ Start time: ____________ Finish time: ____________ Date: ____________ Movements: ____________ Start time: ____________ Finish time: ____________ Date: ____________ Movements: ____________ Start time: ____________ Finish time: ____________ Date: ____________ Movements: ____________ Start time: ____________ Finish time: ____________ Date: ____________ Movements: ____________ Start time: ____________ Finish time: ____________  Date: ____________ Movements: ____________ Start time: ____________ Finish time: ____________ Date: ____________ Movements: ____________ Start time: ____________ Finish time: ____________ Date: ____________ Movements: ____________ Start time: ____________ Finish time: ____________ Date: ____________ Movements: ____________ Start time: ____________ Finish time: ____________ Date: ____________ Movements: ____________ Start time: ____________ Finish time: ____________ Date: ____________ Movements: ____________ Start time: ____________ Finish time: ____________ Date: ____________ Movements: ____________ Start time: ____________ Finish time: ____________  Date: ____________ Movements: ____________ Start time: ____________ Finish time: ____________ Date: ____________ Movements:  ____________ Start time: ____________ Finish time: ____________ Date: ____________ Movements: ____________ Start time: ____________ Finish time: ____________ Date: ____________ Movements: ____________ Start time: ____________ Finish time: ____________ Date: ____________ Movements: ____________ Start time: ____________ Finish time: ____________ Date: ____________ Movements: ____________ Start time: ____________ Finish time: ____________ Date: ____________ Movements: ____________ Start time: ____________ Finish time: ____________  Date: ____________ Movements: ____________ Start time: ____________ Finish time: ____________ Date: ____________ Movements: ____________ Start time: ____________ Finish time: ____________ Date: ____________ Movements: ____________ Start time: ____________ Finish time: ____________ Date: ____________ Movements: ____________ Start time: ____________ Finish time: ____________ Date: ____________ Movements: ____________ Start time: ____________ Finish time: ____________ Date: ____________ Movements: ____________ Start time: ____________ Finish time: ____________ Document Released: 06/02/2006 Document Revised: 04/19/2012 Document Reviewed: 02/28/2012 [employer] Patient Information 2014 [employer].  

## 2013-10-29 ENCOUNTER — Encounter (HOSPITAL_COMMUNITY): Payer: Self-pay | Admitting: *Deleted

## 2013-10-29 ENCOUNTER — Inpatient Hospital Stay (HOSPITAL_COMMUNITY)
Admission: AD | Admit: 2013-10-29 | Discharge: 2013-10-31 | DRG: 775 | Disposition: A | Discharge status: Home / Self Care | Payer: Medicaid Other | Source: Ambulatory Visit | Attending: Obstetrics and Gynecology | Admitting: Obstetrics and Gynecology

## 2013-10-29 ENCOUNTER — Encounter (HOSPITAL_COMMUNITY): Payer: Medicaid Other | Admitting: Anesthesiology

## 2013-10-29 ENCOUNTER — Inpatient Hospital Stay (HOSPITAL_COMMUNITY): Payer: Medicaid Other | Admitting: Anesthesiology

## 2013-10-29 DIAGNOSIS — O480 Post-term pregnancy: Principal | ICD-10-CM | POA: Diagnosis present

## 2013-10-29 DIAGNOSIS — O99344 Other mental disorders complicating childbirth: Secondary | ICD-10-CM | POA: Diagnosis present

## 2013-10-29 DIAGNOSIS — O9903 Anemia complicating the puerperium: Secondary | ICD-10-CM | POA: Diagnosis present

## 2013-10-29 DIAGNOSIS — D649 Anemia, unspecified: Secondary | ICD-10-CM | POA: Diagnosis not present

## 2013-10-29 DIAGNOSIS — F341 Dysthymic disorder: Secondary | ICD-10-CM | POA: Diagnosis present

## 2013-10-29 DIAGNOSIS — R0602 Shortness of breath: Secondary | ICD-10-CM | POA: Diagnosis present

## 2013-10-29 LAB — CBC
HEMATOCRIT: 36.2 % (ref 36.0–46.0)
Hemoglobin: 11.9 g/dL — ABNORMAL LOW (ref 12.0–15.0)
MCH: 25.6 pg — AB (ref 26.0–34.0)
MCHC: 32.9 g/dL (ref 30.0–36.0)
MCV: 77.8 fL — AB (ref 78.0–100.0)
Platelets: 206 10*3/uL (ref 150–400)
RBC: 4.65 MIL/uL (ref 3.87–5.11)
RDW: 14.9 % (ref 11.5–15.5)
WBC: 9.2 10*3/uL (ref 4.0–10.5)

## 2013-10-29 LAB — RPR

## 2013-10-29 LAB — TYPE AND SCREEN
ABO/RH(D): B POS
Antibody Screen: NEGATIVE

## 2013-10-29 LAB — OB RESULTS CONSOLE GBS: GBS: NEGATIVE

## 2013-10-29 LAB — GROUP B STREP BY PCR: Group B strep by PCR: NEGATIVE

## 2013-10-29 MED ORDER — OXYTOCIN 40 UNITS IN LACTATED RINGERS INFUSION - SIMPLE MED
1.0000 m[IU]/min | INTRAVENOUS | Status: DC
  Administered 2013-10-29: 2 m[IU]/min via INTRAVENOUS
  Administered 2013-10-29: 666 m[IU]/min via INTRAVENOUS
  Filled 2013-10-29: qty 1000

## 2013-10-29 MED ORDER — LIDOCAINE HCL (PF) 1 % IJ SOLN
INTRAMUSCULAR | Status: DC | PRN
  Administered 2013-10-29: 3 mL
  Administered 2013-10-29 (×2): 5 mL

## 2013-10-29 MED ORDER — DIBUCAINE 1 % RE OINT: 1.0000 "application " | TOPICAL_OINTMENT | RECTAL | Status: DC | PRN

## 2013-10-29 MED ORDER — IBUPROFEN 600 MG PO TABS
600.0000 mg | ORAL_TABLET | Freq: Four times a day (QID) | ORAL | Status: DC
  Administered 2013-10-29 – 2013-10-31 (×7): 600 mg via ORAL
  Filled 2013-10-29 (×7): qty 1

## 2013-10-29 MED ORDER — IBUPROFEN 600 MG PO TABS: 600.0000 mg | ORAL_TABLET | Freq: Four times a day (QID) | ORAL | Status: DC | PRN

## 2013-10-29 MED ORDER — TETANUS-DIPHTH-ACELL PERTUSSIS 5-2.5-18.5 LF-MCG/0.5 IM SUSP: 0.5000 mL | Freq: Once | INTRAMUSCULAR | Status: DC

## 2013-10-29 MED ORDER — LACTATED RINGERS IV SOLN
INTRAVENOUS | Status: DC
  Administered 2013-10-29: 11:00:00 via INTRAUTERINE

## 2013-10-29 MED ORDER — SENNOSIDES-DOCUSATE SODIUM 8.6-50 MG PO TABS
2.0000 | ORAL_TABLET | ORAL | Status: DC
  Administered 2013-10-29 – 2013-10-31 (×2): 2 via ORAL
  Filled 2013-10-29 (×2): qty 2

## 2013-10-29 MED ORDER — LACTATED RINGERS IV SOLN
INTRAVENOUS | Status: DC
  Administered 2013-10-29 (×3): via INTRAVENOUS

## 2013-10-29 MED ORDER — LIDOCAINE HCL (PF) 1 % IJ SOLN
30.0000 mL | INTRAMUSCULAR | Status: DC | PRN
  Filled 2013-10-29: qty 30

## 2013-10-29 MED ORDER — BENZOCAINE-MENTHOL 20-0.5 % EX AERO
1.0000 "application " | INHALATION_SPRAY | CUTANEOUS | Status: DC | PRN
  Administered 2013-10-29: 1 via TOPICAL
  Filled 2013-10-29: qty 56

## 2013-10-29 MED ORDER — OXYTOCIN BOLUS FROM INFUSION: 500.0000 mL | INTRAVENOUS | Status: DC

## 2013-10-29 MED ORDER — ZOLPIDEM TARTRATE 5 MG PO TABS: 5.0000 mg | ORAL_TABLET | Freq: Every evening | ORAL | Status: DC | PRN

## 2013-10-29 MED ORDER — PHENYLEPHRINE 40 MCG/ML (10ML) SYRINGE FOR IV PUSH (FOR BLOOD PRESSURE SUPPORT)
80.0000 ug | PREFILLED_SYRINGE | INTRAVENOUS | Status: DC | PRN
  Filled 2013-10-29: qty 2
  Filled 2013-10-29 (×2): qty 10

## 2013-10-29 MED ORDER — DIPHENHYDRAMINE HCL 50 MG/ML IJ SOLN: 12.5000 mg | INTRAMUSCULAR | Status: DC | PRN

## 2013-10-29 MED ORDER — ACETAMINOPHEN 325 MG PO TABS: 650.0000 mg | ORAL_TABLET | ORAL | Status: DC | PRN

## 2013-10-29 MED ORDER — ONDANSETRON HCL 4 MG/2ML IJ SOLN: 4.0000 mg | Freq: Four times a day (QID) | INTRAMUSCULAR | Status: DC | PRN

## 2013-10-29 MED ORDER — TERBUTALINE SULFATE 1 MG/ML IJ SOLN: 0.2500 mg | Freq: Once | INTRAMUSCULAR | Status: DC | PRN

## 2013-10-29 MED ORDER — OXYCODONE-ACETAMINOPHEN 5-325 MG PO TABS: 1.0000 | ORAL_TABLET | ORAL | Status: DC | PRN

## 2013-10-29 MED ORDER — PRENATAL MULTIVITAMIN CH
1.0000 | ORAL_TABLET | Freq: Every day | ORAL | Status: DC
  Administered 2013-10-30: 1 via ORAL
  Filled 2013-10-29: qty 1

## 2013-10-29 MED ORDER — SIMETHICONE 80 MG PO CHEW: 80.0000 mg | CHEWABLE_TABLET | ORAL | Status: DC | PRN

## 2013-10-29 MED ORDER — DIPHENHYDRAMINE HCL 25 MG PO CAPS: 25.0000 mg | ORAL_CAPSULE | Freq: Four times a day (QID) | ORAL | Status: DC | PRN

## 2013-10-29 MED ORDER — WITCH HAZEL-GLYCERIN EX PADS: 1.0000 "application " | MEDICATED_PAD | CUTANEOUS | Status: DC | PRN

## 2013-10-29 MED ORDER — ONDANSETRON HCL 4 MG PO TABS: 4.0000 mg | ORAL_TABLET | ORAL | Status: DC | PRN

## 2013-10-29 MED ORDER — EPHEDRINE 5 MG/ML INJ
10.0000 mg | INTRAVENOUS | Status: DC | PRN
  Filled 2013-10-29: qty 2
  Filled 2013-10-29 (×2): qty 4

## 2013-10-29 MED ORDER — LANOLIN HYDROUS EX OINT: TOPICAL_OINTMENT | CUTANEOUS | Status: DC | PRN

## 2013-10-29 MED ORDER — OXYTOCIN 40 UNITS IN LACTATED RINGERS INFUSION - SIMPLE MED: 62.5000 mL/h | INTRAVENOUS | Status: DC

## 2013-10-29 MED ORDER — MEDROXYPROGESTERONE ACETATE 150 MG/ML IM SUSP: 150.0000 mg | INTRAMUSCULAR | Status: DC | PRN

## 2013-10-29 MED ORDER — FENTANYL 2.5 MCG/ML BUPIVACAINE 1/10 % EPIDURAL INFUSION (WH - ANES)
INTRAMUSCULAR | Status: DC | PRN
  Administered 2013-10-29: 14 mL/h via EPIDURAL

## 2013-10-29 MED ORDER — EPHEDRINE 5 MG/ML INJ
10.0000 mg | INTRAVENOUS | Status: DC | PRN
  Administered 2013-10-29: 10 mg via INTRAVENOUS
  Filled 2013-10-29: qty 2

## 2013-10-29 MED ORDER — LACTATED RINGERS IV SOLN: 500.0000 mL | INTRAVENOUS | Status: DC | PRN

## 2013-10-29 MED ORDER — BUTORPHANOL TARTRATE 1 MG/ML IJ SOLN: 2.0000 mg | INTRAMUSCULAR | Status: DC | PRN

## 2013-10-29 MED ORDER — LACTATED RINGERS IV SOLN
500.0000 mL | Freq: Once | INTRAVENOUS | Status: AC
  Administered 2013-10-29: 500 mL via INTRAVENOUS

## 2013-10-29 MED ORDER — CITRIC ACID-SODIUM CITRATE 334-500 MG/5ML PO SOLN: 30.0000 mL | ORAL | Status: DC | PRN

## 2013-10-29 MED ORDER — PHENYLEPHRINE 40 MCG/ML (10ML) SYRINGE FOR IV PUSH (FOR BLOOD PRESSURE SUPPORT)
80.0000 ug | PREFILLED_SYRINGE | INTRAVENOUS | Status: DC | PRN
  Filled 2013-10-29: qty 2

## 2013-10-29 MED ORDER — ONDANSETRON HCL 4 MG/2ML IJ SOLN: 4.0000 mg | INTRAMUSCULAR | Status: DC | PRN

## 2013-10-29 MED ORDER — OXYCODONE-ACETAMINOPHEN 5-325 MG PO TABS
1.0000 | ORAL_TABLET | ORAL | Status: DC | PRN
  Administered 2013-10-30: 1 via ORAL
  Filled 2013-10-29: qty 1

## 2013-10-29 MED ORDER — FENTANYL 2.5 MCG/ML BUPIVACAINE 1/10 % EPIDURAL INFUSION (WH - ANES)
14.0000 mL/h | INTRAMUSCULAR | Status: DC | PRN
  Administered 2013-10-29: 14 mL/h via EPIDURAL
  Filled 2013-10-29 (×2): qty 125

## 2013-10-29 NOTE — Progress Notes (Signed)
Patient ID: Miranda Esparza, female   DOB: 22-Apr-1991, 23 y.o.   MRN: [id number] Miranda Esparza is a 23 y.o. G2P0010 at 6464w6d admitted for labor  Subjective: Comfortable w epidural  Objective: BP 114/72  Pulse 98  Temp(Src) 98.4 F (36.9 C) (Oral)  Resp 18  Ht 5\' 1"  (1.549 m)  Wt 129 lb (58.514 kg)  BMI 24.39 kg/m2  SpO2 99%  LMP 01/16/2013     FHT:  Cat 1 UC:   toco 4-5, lasting 2-73min long   SVE:   Dilation: 5 Effacement (%): 100 Station: -1 Exam by:: S Otie Headlee, CNM  No evidence of ROM, attempt to AROM, w no gross fluid returned, no BOW palpated, some mucous and bloody show noted  Assessment / Plan:  Labor: prolonged latent phase, but now progressing Preeclampsia:  no s/s Fetal Wellbeing:  Category I Pain Control:  Epidural Anticipated MOD:  NSVD  GBS neg - rapid PCR also neg on 4/28  Will CTO for fluid return  Recheck in about 2 hours and if not progressing consider pitocin aug, pt agreeable   Update physician PRN    Malissa HippoLILLARD,Rise Traeger M 10/29/2013, 7:27 AM

## 2013-10-29 NOTE — Anesthesia Preprocedure Evaluation (Signed)

## 2013-10-29 NOTE — MAU Note (Signed)
Pt reports painful contractions for 2 hours.

## 2013-10-29 NOTE — Progress Notes (Signed)
  Subjective: Assumed care. Comfortable with epidural but feeling rectal pressure.  Objective: BP 117/72  Pulse 106  Temp(Src) 98.4 F (36.9 C) (Oral)  Resp 18  Ht 5\' 1"  (1.549 m)  Wt 129 lb (58.514 kg)  BMI 24.39 kg/m2  SpO2 99%  LMP 01/16/2013     FHT: Category 2 -- occ variable, no late decels, +accels UC:   irregular, every 8 minutes, palpate mild SVE:   Dilation: 4 Effacement (%): 80 Station: -2 Exam by:: Sherre ScarletKimberly Kisa Fujii, cnm   Assessment:  IUP at 40 6/7 wks Early labor EFW 6 1/4 lbs  Plan: Reviewed R&B of Pitocin augmentation with patient. Patient understands indication for augmentation and is agreeable with proceeding. Pitocin per protocol. IUPC placed. Expect progress and SVD. Consult prn.    Sherre ScarletWILLIAMS, Mirtie Bastyr CNM 10/29/2013, 8:03 AM

## 2013-10-29 NOTE — Progress Notes (Signed)
  Subjective: Called to room to evaluate left sided pain and urge to push.   Objective: BP 128/80  Pulse 113  Temp(Src) 99.8 F (37.7 C) (Oral)  Resp 18  Ht 5\' 1"  (1.549 m)  Wt 129 lb (58.514 kg)  BMI 24.39 kg/m2  SpO2 99%  LMP 01/16/2013      FHT: Variables, occ late - moderate variability between variables and lates UC:   q 2-3 min SVE:   Dilation: 10 Effacement (%): 100 Station: +1 Exam by:: Sherre ScarletKimberly Eligah Anello, cnm   Assessment:  Cat II  Plan: Commence w/ pushing, but with every other ctx Dr. Normand Sloopillard aware of tracing Expect SVD briefly  Sherre ScarletWILLIAMS, Randen Kauth CNM 10/29/2013, 2:29 PM

## 2013-10-29 NOTE — Progress Notes (Addendum)
  Subjective: Comfortable w/ epidural. Family at bedside  Objective: BP 118/59  Pulse 118  Temp(Src) 98.2 F (36.8 C) (Oral)  Resp 16  Ht 5\' 1"  (1.549 m)  Wt 129 lb (58.514 kg)  BMI 24.39 kg/m2  SpO2 99%  LMP 01/16/2013    FHT: Category 2 (early decels, variable decels, occ lates) UC:   MUVs 95 SVE:   5-6/90/-1 Pitocin at 8 mius/min  Inadequate ctxs but noted cervical change  Assessment:  IUP at 40.6 wks Progressive labor Earlys, variables and occ lates resolving w/ position change - moderate variability throughout  Plan: Reviewed strip w/ Dr. Normand Sloopillard Maternal resuscitative measures Amnioinfusion Expect progress and SVD    Sherre ScarletWILLIAMS, Camielle Sizer CNM 10/29/2013, 11:05 AM

## 2013-10-29 NOTE — Anesthesia Procedure Notes (Signed)
Epidural Patient location during procedure: OB  Staffing Anesthesiologist: Esteven Overfelt Performed by: anesthesiologist   Preanesthetic Checklist Completed: patient identified, site marked, surgical consent, pre-op evaluation, timeout performed, IV checked, risks and benefits discussed and monitors and equipment checked  Epidural Patient position: sitting Prep: ChloraPrep Patient monitoring: heart rate, continuous pulse ox and blood pressure Approach: right paramedian Location: L3-L4 Injection technique: LOR saline  Needle:  Needle type: Tuohy  Needle gauge: 17 G Needle length: 9 cm and 9 Needle insertion depth: 4 cm Catheter type: closed end flexible Catheter size: 20 Guage Catheter at skin depth: 9 cm Test dose: negative  Assessment Events: blood not aspirated, injection not painful, no injection resistance, negative IV test and no paresthesia  Additional Notes   Patient tolerated the insertion well without complications.   

## 2013-10-29 NOTE — H&P (Signed)
Miranda Esparza is a 23 y.o. female presenting for labor eval, was seen yesterday evening for ctx and given IM meds for therapeutic rest which did give pt some relief until this evening, now ctx have been more regular and painful, +bloody show, no LOF, +FM.   Pt began PNC at [facility] at 19wks 19wk anatomy US, limited views but otherwise WNL c/w dates  Quad screen WNL 22wk f/u anatomy US WNL 1hr glucola normal  Tx'd at 30wks for PT ctx, w procardia, FFN was neg, GC/CT neg  GBS neg 4/27 Growth US at 35wks for S<D - EFW 41%, and all WNL On 6/4 seen in MAU for persistent dizziness, improved w IVF's, labs found to be WNL     Maternal Medical History:  Reason for admission: Contractions.   Contractions: Onset was 2 days ago.   Frequency: regular.   Perceived severity is moderate.    Fetal activity: Perceived fetal activity is normal.   Last perceived fetal movement was within the past hour.    Prenatal complications: no prenatal complications Prenatal Complications - Diabetes: none.    OB History   Grav Para Term Preterm Abortions TAB SAB Ect Mult Living   2    1  1    0     Past Medical History  Diagnosis Date  . Shortness of breath     since having bronchitis May 2014  . Anxiety   . Depression   . Heart murmur   . Anemia    Past Surgical History  Procedure Laterality Date  . Thumb surgery  2010  . Right thumb      for fracture   Family History: family history includes Hypertension in her paternal grandmother. Social History:  reports that she has never smoked. She has never used smokeless tobacco. She reports that she does not drink alcohol or use illicit drugs.   Prenatal Transfer Tool  Maternal Diabetes: No Genetic Screening: Normal Maternal Ultrasounds/Referrals: Normal Fetal Ultrasounds or other Referrals:  None Maternal Substance Abuse:  No Significant Maternal Medications:  None Significant Maternal Lab Results:  Lab values include: Group B Strep  negative Other Comments:  None  Review of Systems  All other systems reviewed and are negative.   Dilation: 3 Effacement (%): 100 Station: -1 Exam by:: S Raechelle Sarti Blood pressure 135/79, pulse 118, temperature 98 F (36.7 C), temperature source Oral, resp. rate 18, height 5\' 1"  (1.549 m), weight 129 lb (58.514 kg), last menstrual period 01/16/2013, SpO2 90.00%. Maternal Exam:  Uterine Assessment: Contraction strength is moderate.  Contraction frequency is regular.   Abdomen: Patient reports no abdominal tenderness. Fundal height is aga.   Estimated fetal weight is 7-8#.   Fetal presentation: vertex  Introitus: Normal vulva. Normal vagina.  Pelvis: adequate for delivery.   Cervix: Cervix evaluated by digital exam.     Fetal Exam Fetal Monitor Review: Mode: ultrasound.    Fetal State Assessment: Category I - tracings are normal.     Physical Exam  Nursing note and vitals reviewed. Constitutional: She is oriented to person, place, and time. She appears well-developed and well-nourished.  HENT:  Head: Normocephalic.  Eyes: Pupils are equal, round, and reactive to light.  Neck: Normal range of motion.  Cardiovascular: Normal rate, regular rhythm and normal heart sounds.   Respiratory: Effort normal and breath sounds normal.  GI: Soft. Bowel sounds are normal.  Genitourinary: Vagina normal.  Musculoskeletal: Normal range of motion.  Neurological: She is alert and oriented to  person, place, and time. She has normal reflexes.  Skin: Skin is warm and dry.  Psychiatric: She has a normal mood and affect. Her behavior is normal.    Prenatal labs: ABO, Rh: --/--/B POS (06/15 0230) Antibody: NEG (06/15 0230) Rubella: Immune (12/30 0000) RPR: Nonreactive (12/30 0000)  HBsAg: Negative (12/30 0000)  HIV: Non-reactive (12/30 0000)  GBS: Negative (04/25 0000)   Assessment/Plan: IUP at 5251w6d GBS neg FHR cat 1 Early labor  Admit to b.s. Per c/w Dr Gunnar BullaVernado Routine L&D  orders Will do rapid GBS swab, (GBS neg on 4/27)  Exp mgmnt at present, consider pitocin aug   Cyerra Yim M 10/29/2013, 3:55 AM

## 2013-10-29 NOTE — Progress Notes (Addendum)
  Subjective: Called to room to evaluate strip due to prolonged late decel.  Objective: BP 117/57  Pulse 116  Temp(Src) 98.5 F (36.9 C) (Axillary)  Resp 18  Ht 5\' 1"  (1.549 m)  Wt 129 lb (58.514 kg)  BMI 24.39 kg/m2  SpO2 99%  LMP 01/16/2013      FHT: Category 2 -- FHR down to 110 then 70-80s x 6-7 min - moderate variability noted UC:   Every 1-2 minutes SVE: 7-8/100/+1     Pitocin off  Assessment:  Late decels likely due to rapid descent and tachysystole Progressive labor  Plan: Discussed w/ Dr. Normand Sloopillard 02, position change FSE Monitor closely Expect progress and SVD   Sherre ScarletWILLIAMS, Miranda Esparza CNM 10/29/2013, 11:59 AM

## 2013-10-29 NOTE — Lactation Note (Signed)
This note was copied from the chart of Boy Farrel Connersytanisha Mates. Lactation Consultation Note  Patient Name: Boy Farrel Connersytanisha Manske NWGNF'AToday's Date: 10/29/2013 Reason for consult: Initial assessment of this primipara and her newborn at 5 hours postpartum.  Mom has been shown hand expression, as stated by mom and documented by RN, Beth at 1700 feeding.  Mom says baby seems to prefer (R) breast but she is offering both and is able to express her colostrum.  LC encouraged STS and cue feedings.  Baby has fed for 10 minutes 3x since birth and has passed first stool.  LC encouraged review of Baby and Me pp 9, 14 and 20-25 for STS and BF information. LC provided [facility] Resource brochure and reviewed [facility] and list of community and web site resources.    Maternal Data Formula Feeding for Exclusion: No Infant to breast within first hour of birth: Yes Has patient been taught Hand Expression?: Yes (mom says her nurse demonstrated hand expression tonight; see 1700 feeding documentation) Does the patient have breastfeeding experience prior to this delivery?: No  Feeding Feeding Type: Breast Fed Length of feed: 10 min  LATCH Score/Interventions Latch: Repeated attempts needed to sustain latch, nipple held in mouth throughout feeding, stimulation needed to elicit sucking reflex. Intervention(s): Assist with latch;Breast massage;Breast compression  Audible Swallowing: None Intervention(s): Skin to skin;Hand expression  Type of Nipple: Everted at rest and after stimulation  Comfort (Breast/Nipple): Soft / non-tender     Hold (Positioning): Assistance needed to correctly position infant at breast and maintain latch. Intervention(s): Breastfeeding basics reviewed;Support Pillows;Position options;Skin to skin  LATCH Score: 6 (previous feeding assessment per RN)  Lactation Tools Discussed/Used WIC Program: Yes [facility] STS, cue feedings, hand expression  Consult Status Consult  Status: Follow-up Date: 10/30/13 Follow-up type: In-patient    Warrick ParisianBryant, Yehuda Printup [facility] 10/29/2013, 8:39 PM

## 2013-10-30 ENCOUNTER — Inpatient Hospital Stay (HOSPITAL_COMMUNITY): Admission: RE | Admit: 2013-10-30 | Payer: Medicaid Other | Source: Ambulatory Visit

## 2013-10-30 LAB — CBC
HEMATOCRIT: 28.4 % — AB (ref 36.0–46.0)
Hemoglobin: 9.4 g/dL — ABNORMAL LOW (ref 12.0–15.0)
MCH: 25.5 pg — AB (ref 26.0–34.0)
MCHC: 33.1 g/dL (ref 30.0–36.0)
MCV: 77 fL — ABNORMAL LOW (ref 78.0–100.0)
PLATELETS: 133 10*3/uL — AB (ref 150–400)
RBC: 3.69 MIL/uL — ABNORMAL LOW (ref 3.87–5.11)
RDW: 15.1 % (ref 11.5–15.5)
WBC: 11.8 10*3/uL — AB (ref 4.0–10.5)

## 2013-10-30 MED ORDER — FERROUS SULFATE 325 (65 FE) MG PO TABS
325.0000 mg | ORAL_TABLET | Freq: Two times a day (BID) | ORAL | Status: DC
  Administered 2013-10-30 (×2): 325 mg via ORAL
  Filled 2013-10-30 (×2): qty 1

## 2013-10-30 NOTE — Progress Notes (Signed)
Patient was referred for history of depression/anxiety.  * Referral screened out by Clinical Social Worker because none of the following criteria appear to apply:  ~ History of anxiety/depression during this pregnancy, or of post-partum depression.  ~ Diagnosis of anxiety and/or depression within last 4 years, per pt.  ~ History of depression due to pregnancy loss/loss of child  OR  * Patient's symptoms currently being treated with medication and/or therapy.  Please contact the Clinical Social Worker if needs arise, or by the patient's request.       

## 2013-10-30 NOTE — Progress Notes (Signed)
Subjective: Postpartum Day 1: Vaginal delivery, bilateral periurethral lacerations and vaginal skid mark - no repair required. Patient up ad lib, reports no syncope or dizziness; occasionally feels hot. Feeding:  Breast Contraceptive plan:  Undecided - states will discuss with provider at 6-wk check  +flatus, no BM, tolerating regular diet  Objective: Vital signs in last 24 hours: Temp:  [98.2 F (36.8 C)-100.1 F (37.8 C)] 98.2 F (36.8 C) (06/15 2220) Pulse Rate:  [83-143] 83 (06/15 2220) Resp:  [16-20] 18 (06/15 2220) BP: (105-153)/(53-84) 105/68 mmHg (06/15 2220) SpO2:  [100 %] 100 % (06/15 2220)  Physical Exam:  General: alert VSS, afebrile Lochia: appropriate Uterine Fundus: firm Perineum: Intact DVT Evaluation: No evidence of DVT seen on physical exam. Negative Homan's sign.    Recent Labs  10/29/13 0230 10/30/13 0602  HGB 11.9* 9.4*  HCT 36.2 28.4*    Assessment/Plan: Status post vaginal delivery day 1. Anemia - hemodynamically stable; discussed possible blood transfusion if becomes symptomatic. Will begin Ferrous Sulfate bid. Continue current care. H/O depression/anxiety - no meds; no issues currently. SWS consult placed. Plan for discharge tomorrow if clinically stable.  Plans outpt circ.    Robyne AskewWILLIAMS, KIMBERLYCNM 10/30/2013, 9:01 AM

## 2013-10-30 NOTE — Anesthesia Postprocedure Evaluation (Signed)
  Anesthesia Post-op Note  Patient: Miranda Esparza  Procedure(s) Performed: * No procedures listed *  Patient Location: Mother/Baby  Anesthesia Type:Epidural  Level of Consciousness: awake  Airway and Oxygen Therapy: Patient Spontanous Breathing  Post-op Pain: none  Post-op Assessment: Patient's Cardiovascular Status Stable, Respiratory Function Stable, Patent Airway, NAUSEA AND VOMITING PRESENT, Adequate PO intake, Pain level controlled, No headache, No backache, No residual numbness and No residual motor weakness  Post-op Vital Signs: Reviewed and stable  Last Vitals:  Filed Vitals:   10/29/13 2220  BP: 105/68  Pulse: 83  Temp: 36.8 C  Resp: 18    Complications: No apparent anesthesia complications

## 2013-10-30 NOTE — Progress Notes (Signed)
UR chart review completed.  

## 2013-10-31 MED ORDER — IBUPROFEN 600 MG PO TABS: 600.0000 mg | ORAL_TABLET | Freq: Four times a day (QID) | ORAL | Status: AC | PRN

## 2013-10-31 MED ORDER — FERROUS SULFATE 325 (65 FE) MG PO TABS: 325.0000 mg | ORAL_TABLET | Freq: Two times a day (BID) | ORAL | Status: AC

## 2013-10-31 NOTE — Lactation Note (Signed)
This note was copied from the chart of Miranda Farrel Connersytanisha Herberger. Lactation Consultation Note  Patient Name: Miranda Esparza ZOXWR'UToday's Date: 10/31/2013  Lactation follow up. Mother has baby latched and demonstrating independence. Baby is wrapped in blankets and mother leaning over baby to feed. Assisted mother with using her boppy pillow to position baby closer to the breast and removed the blankets. Discussed obtaining deeper and comfortable latch will allow for better transfer of milk to the baby. Mother's breast are filling, colostrum expressed after hand expression teaching reviewed with mother. Mother reports she had inverted nipples prior to pregnancy and nipples have become everted with pregnancy and better with using the hand pump prior to latch. Denies discomfort with the latch. Praise given and Mom made aware of O/P services, breastfeeding support groups, community resources, and our phone # for post-discharge questions.     Maternal Data    Feeding    LATCH Score/Interventions                      Lactation Tools Discussed/Used     Consult Status      Miranda Esparza, Miranda Esparza 10/31/2013, 10:11 AM

## 2013-10-31 NOTE — Discharge Instructions (Signed)
Anemia, Nonspecific Anemia is a condition in which the concentration of red blood cells or hemoglobin in the blood is below normal. Hemoglobin is a substance in red blood cells that carries oxygen to the tissues of the body. Anemia results in not enough oxygen reaching these tissues.  CAUSES  Common causes of anemia include:   Excessive bleeding. Bleeding may be internal or external. This includes excessive bleeding from periods (in women) or from the intestine.   Poor nutrition.   Chronic kidney, thyroid, and liver disease.  Bone marrow disorders that decrease red blood cell production.  Cancer and treatments for cancer.  HIV, AIDS, and their treatments.  Spleen problems that increase red blood cell destruction.  Blood disorders.  Excess destruction of red blood cells due to infection, medicines, and autoimmune disorders. SIGNS AND SYMPTOMS   Minor weakness.   Dizziness.   Headache.  Palpitations.   Shortness of breath, especially with exercise.   Paleness.  Cold sensitivity.  Indigestion.  Nausea.  Difficulty sleeping.  Difficulty concentrating. Symptoms may occur suddenly or they may develop slowly.  DIAGNOSIS  Additional blood tests are often needed. These help your health care provider determine the best treatment. Your health care provider will check your stool for blood and look for other causes of blood loss.  TREATMENT  Treatment varies depending on the cause of the anemia. Treatment can include:   Supplements of iron, vitamin O75, or folic acid.   Hormone medicines.   A blood transfusion. This may be needed if blood loss is severe.   Hospitalization. This may be needed if there is significant continual blood loss.   Dietary changes.  Spleen removal. HOME CARE INSTRUCTIONS Keep all follow-up appointments. It often takes many weeks to correct anemia, and having your health care provider check on your condition and your response to  treatment is very important. SEEK IMMEDIATE MEDICAL CARE IF:   You develop extreme weakness, shortness of breath, or chest pain.   You become dizzy or have trouble concentrating.  You develop heavy vaginal bleeding.   You develop a rash.   You have bloody or black, tarry stools.   You faint.   You vomit up blood.   You vomit repeatedly.   You have abdominal pain.  You have a fever or persistent symptoms for more than 2-3 days.   You have a fever and your symptoms suddenly get worse.   You are dehydrated.  MAKE SURE YOU:  Understand these instructions.  Will watch your condition.  Will get help right away if you are not doing well or get worse. Document Released: 06/10/2004 Document Revised: 01/03/2013 Document Reviewed: 10/27/2012 [facility] Patient Information 2015 [place], [place]. This information is not intended to replace advice given to you by your health care provider. Make sure you discuss any questions you have with your health care provider. Breastfeeding Deciding to breastfeed is one of the best choices you can make for you and your baby. A change in hormones during pregnancy causes your breast tissue to grow and increases the number and size of your milk ducts. These hormones also allow proteins, sugars, and fats from your blood supply to make breast milk in your milk-producing glands. Hormones prevent breast milk from being released before your baby is born as well as prompt milk flow after birth. Once breastfeeding has begun, thoughts of your baby, as well as his or her sucking or crying, can stimulate the release of milk from your milk-producing glands.  BENEFITS  OF BREASTFEEDING For Your Baby  Your first milk (colostrum) helps your baby's digestive system function better.   There are antibodies in your milk that help your baby fight off infections.   Your baby has a lower incidence of asthma, allergies, and sudden infant death syndrome.   The  nutrients in breast milk are better for your baby than infant formulas and are designed uniquely for your baby's needs.   Breast milk improves your baby's brain development.   Your baby is less likely to develop other conditions, such as childhood obesity, asthma, or type 2 diabetes mellitus.  For You   Breastfeeding helps to create a very special bond between you and your baby.   Breastfeeding is convenient. Breast milk is always available at the correct temperature and costs nothing.   Breastfeeding helps to burn calories and helps you lose the weight gained during pregnancy.   Breastfeeding makes your uterus contract to its prepregnancy size faster and slows bleeding (lochia) after you give birth.   Breastfeeding helps to lower your risk of developing type 2 diabetes mellitus, osteoporosis, and breast or ovarian cancer later in life. SIGNS THAT YOUR BABY IS HUNGRY Early Signs of Hunger  Increased alertness or activity.  Stretching.  Movement of the head from side to side.  Movement of the head and opening of the mouth when the corner of the mouth or cheek is stroked (rooting).  Increased sucking sounds, smacking lips, cooing, sighing, or squeaking.  Hand-to-mouth movements.  Increased sucking of fingers or hands. Late Signs of Hunger  Fussing.  Intermittent crying. Extreme Signs of Hunger Signs of extreme hunger will require calming and consoling before your baby will be able to breastfeed successfully. Do not wait for the following signs of extreme hunger to occur before you initiate breastfeeding:   Restlessness.  A loud, strong cry.   Screaming. BREASTFEEDING BASICS Breastfeeding Initiation  Find a comfortable place to sit or lie down, with your neck and back well supported.  Place a pillow or rolled up blanket under your baby to bring him or her to the level of your breast (if you are seated). Nursing pillows are specially designed to help support  your arms and your baby while you breastfeed.  Make sure that your baby's abdomen is facing your abdomen.   Gently massage your breast. With your fingertips, massage from your chest wall toward your nipple in a circular motion. This encourages milk flow. You may need to continue this action during the feeding if your milk flows slowly.  Support your breast with 4 fingers underneath and your thumb above your nipple. Make sure your fingers are well away from your nipple and your baby's mouth.   Stroke your baby's lips gently with your finger or nipple.   When your baby's mouth is open wide enough, quickly bring your baby to your breast, placing your entire nipple and as much of the colored area around your nipple (areola) as possible into your baby's mouth.   More areola should be visible above your baby's upper lip than below the lower lip.   Your baby's tongue should be between his or her lower gum and your breast.   Ensure that your baby's mouth is correctly positioned around your nipple (latched). Your baby's lips should create a seal on your breast and be turned out (everted).  It is common for your baby to suck about 2-3 minutes in order to start the flow of breast milk. Latching Teaching your  baby how to latch on to your breast properly is very important. An improper latch can cause nipple pain and decreased milk supply for you and poor weight gain in your baby. Also, if your baby is not latched onto your nipple properly, he or she may swallow some air during feeding. This can make your baby fussy. Burping your baby when you switch breasts during the feeding can help to get rid of the air. However, teaching your baby to latch on properly is still the best way to prevent fussiness from swallowing air while breastfeeding. Signs that your baby has successfully latched on to your nipple:    Silent tugging or silent sucking, without causing you pain.   Swallowing heard between every  3-4 sucks.    Muscle movement above and in front of his or her ears while sucking.  Signs that your baby has not successfully latched on to nipple:   Sucking sounds or smacking sounds from your baby while breastfeeding.  Nipple pain. If you think your baby has not latched on correctly, slip your finger into the corner of your baby's mouth to break the suction and place it between your baby's gums. Attempt breastfeeding initiation again. Signs of Successful Breastfeeding Signs from your baby:   A gradual decrease in the number of sucks or complete cessation of sucking.   Falling asleep.   Relaxation of his or her body.   Retention of a small amount of milk in his or her mouth.   Letting go of your breast by himself or herself. Signs from you:  Breasts that have increased in firmness, weight, and size 1-3 hours after feeding.   Breasts that are softer immediately after breastfeeding.  Increased milk volume, as well as a change in milk consistency and color by the fifth day of breastfeeding.   Nipples that are not sore, cracked, or bleeding. Signs That Your [employer] is Getting Enough Milk  Wetting at least 3 diapers in a 24-hour period. The urine should be clear and pale yellow by age 245 days.  At least 3 stools in a 24-hour period by age 245 days. The stool should be soft and yellow.  At least 3 stools in a 24-hour period by age 37 days. The stool should be seedy and yellow.  No loss of weight greater than 10% of birth weight during the first 86 days of age.  Average weight gain of 4-7 ounces (113-198 g) per week after age 24 days.  Consistent daily weight gain by age 36 days, without weight loss after the age of 2 weeks. After a feeding, your baby may spit up a small amount. This is common. BREASTFEEDING FREQUENCY AND DURATION Frequent feeding will help you make more milk and can prevent sore nipples and breast engorgement. Breastfeed when you feel the need to reduce the  fullness of your breasts or when your baby shows signs of hunger. This is called "breastfeeding on demand." Avoid introducing a pacifier to your baby while you are working to establish breastfeeding (the first 4-6 weeks after your baby is born). After this time you may choose to use a pacifier. Research has shown that pacifier use during the first year of a baby's life decreases the risk of sudden infant death syndrome (SIDS). Allow your baby to feed on each breast as long as he or she wants. Breastfeed until your baby is finished feeding. When your baby unlatches or falls asleep while feeding from the first breast, offer the second  breast. Because newborns are often sleepy in the first few weeks of life, you may need to awaken your baby to get him or her to feed. Breastfeeding times will vary from baby to baby. However, the following rules can serve as a guide to help you ensure that your baby is properly fed:  Newborns (babies 89 weeks of age or younger) may breastfeed every 1-3 hours.  Newborns should not go longer than 3 hours during the day or 5 hours during the night without breastfeeding.  You should breastfeed your baby a minimum of 8 times in a 24-hour period until you begin to introduce solid foods to your baby at around 63 months of age. BREAST MILK PUMPING Pumping and storing breast milk allows you to ensure that your baby is exclusively fed your breast milk, even at times when you are unable to breastfeed. This is especially important if you are going back to work while you are still breastfeeding or when you are not able to be present during feedings. Your lactation consultant can give you guidelines on how long it is safe to store breast milk.  A breast pump is a machine that allows you to pump milk from your breast into a sterile bottle. The pumped breast milk can then be stored in a refrigerator or freezer. Some breast pumps are operated by hand, while others use electricity. Ask your  lactation consultant which type will work best for you. Breast pumps can be purchased, but some hospitals and breastfeeding support groups lease breast pumps on a monthly basis. A lactation consultant can teach you how to hand express breast milk, if you prefer not to use a pump.  CARING FOR YOUR BREASTS WHILE YOU BREASTFEED Nipples can become dry, cracked, and sore while breastfeeding. The following recommendations can help keep your breasts moisturized and healthy:  Avoid using soap on your nipples.   Wear a supportive bra. Although not required, special nursing bras and tank tops are designed to allow access to your breasts for breastfeeding without taking off your entire bra or top. Avoid wearing underwire-style bras or extremely tight bras.  Air dry your nipples for 3-55minutes after each feeding.   Use only cotton bra pads to absorb leaked breast milk. Leaking of breast milk between feedings is normal.   Use lanolin on your nipples after breastfeeding. Lanolin helps to maintain your skin's normal moisture barrier. If you use pure lanolin, you do not need to wash it off before feeding your baby again. Pure lanolin is not toxic to your baby. You may also hand express a few drops of breast milk and gently massage that milk into your nipples and allow the milk to air dry. In the first few weeks after giving birth, some women experience extremely full breasts (engorgement). Engorgement can make your breasts feel heavy, warm, and tender to the touch. Engorgement peaks within 3-5 days after you give birth. The following recommendations can help ease engorgement:  Completely empty your breasts while breastfeeding or pumping. You may want to start by applying warm, moist heat (in the shower or with warm water-soaked hand towels) just before feeding or pumping. This increases circulation and helps the milk flow. If your baby does not completely empty your breasts while breastfeeding, pump any extra  milk after he or she is finished.  Wear a snug bra (nursing or regular) or tank top for 1-2 days to signal your body to slightly decrease milk production.  Apply ice packs to your  breasts, unless this is too uncomfortable for you.  Make sure that your baby is latched on and positioned properly while breastfeeding. If engorgement persists after 48 hours of following these recommendations, contact your health care provider or a Advertising copywriterlactation consultant. OVERALL HEALTH CARE RECOMMENDATIONS WHILE BREASTFEEDING  Eat healthy foods. Alternate between meals and snacks, eating 3 of each per day. Because what you eat affects your breast milk, some of the foods may make your baby more irritable than usual. Avoid eating these foods if you are sure that they are negatively affecting your baby.  Drink milk, fruit juice, and water to satisfy your thirst (about 10 glasses a day).   Rest often, relax, and continue to take your prenatal vitamins to prevent fatigue, stress, and anemia.  Continue breast self-awareness checks.  Avoid chewing and smoking tobacco.  Avoid alcohol and drug use. Some medicines that may be harmful to your baby can pass through breast milk. It is important to ask your health care provider before taking any medicine, including all over-the-counter and prescription medicine as well as vitamin and herbal supplements. It is possible to become pregnant while breastfeeding. If birth control is desired, ask your health care provider about options that will be safe for your baby. SEEK MEDICAL CARE IF:   You feel like you want to stop breastfeeding or have become frustrated with breastfeeding.  You have painful breasts or nipples.  Your nipples are cracked or bleeding.  Your breasts are red, tender, or warm.  You have a swollen area on either breast.  You have a fever or chills.  You have nausea or vomiting.  You have drainage other than breast milk from your nipples.  Your breasts  do not become full before feedings by the fifth day after you give birth.  You feel sad and depressed.  Your baby is too sleepy to eat well.  Your baby is having trouble sleeping.   Your baby is wetting less than 3 diapers in a 24-hour period.  Your baby has less than 3 stools in a 24-hour period.  Your baby's skin or the white part of his or her eyes becomes yellow.   Your baby is not gaining weight by 565 days of age. SEEK IMMEDIATE MEDICAL CARE IF:   Your baby is overly tired (lethargic) and does not want to wake up and feed.  Your baby develops an unexplained fever. Document Released: 05/03/2005 Document Revised: 05/08/2013 Document Reviewed: 10/25/2012 [facility] Patient Information 2015 [place]. This information is not intended to replace advice given to you by your health care provider. Make sure you discuss any questions you have with your health care provider. Postpartum Care After Vaginal Delivery After you deliver your newborn (postpartum period), the usual stay in the hospital is 24-72 hours. If there were problems with your labor or delivery, or if you have other medical problems, you might be in the hospital longer.  While you are in the hospital, you will receive help and instructions on how to care for yourself and your newborn during the postpartum period.  While you are in the hospital:  Be sure to tell your nurses if you have pain or discomfort, as well as where you feel the pain and what makes the pain worse.  If you had an incision made near your vagina (episiotomy) or if you had some tearing during delivery, the nurses may put ice packs on your episiotomy or tear. The ice packs may help to reduce the pain  and swelling.  If you are breastfeeding, you may feel uncomfortable contractions of your uterus for a couple of weeks. This is normal. The contractions help your uterus get back to normal size.  It is normal to have some bleeding after delivery.  For  the first 1-3 days after delivery, the flow is red and the amount may be similar to a period.  It is common for the flow to start and stop.  In the first few days, you may pass some small clots. Let your nurses know if you begin to pass large clots or your flow increases.  Do not  flush blood clots down the toilet before having the nurse look at them.  During the next 3-10 days after delivery, your flow should become more watery and pink or brown-tinged in color.  Ten to fourteen days after delivery, your flow should be a small amount of yellowish-white discharge.  The amount of your flow will decrease over the first few weeks after delivery. Your flow may stop in 6-8 weeks. Most women have had their flow stop by 12 weeks after delivery.  You should change your sanitary pads frequently.  Wash your hands thoroughly with soap and water for at least 20 seconds after changing pads, using the toilet, or before holding or feeding your newborn.  You should feel like you need to empty your bladder within the first 6-8 hours after delivery.  In case you become weak, lightheaded, or faint, call your nurse before you get out of bed for the first time and before you take a shower for the first time.  Within the first few days after delivery, your breasts may begin to feel tender and full. This is called engorgement. Breast tenderness usually goes away within 48-72 hours after engorgement occurs. You may also notice milk leaking from your breasts. If you are not breastfeeding, do not stimulate your breasts. Breast stimulation can make your breasts produce more milk.  Spending as much time as possible with your newborn is very important. During this time, you and your newborn can feel close and get to know each other. Having your newborn stay in your room (rooming in) will help to strengthen the bond with your newborn. It will give you time to get to know your newborn and become comfortable caring for  your newborn.  Your hormones change after delivery. Sometimes the hormone changes can temporarily cause you to feel sad or tearful. These feelings should not last more than a few days. If these feelings last longer than that, you should talk to your caregiver.  If desired, talk to your caregiver about methods of family planning or contraception.  Talk to your caregiver about immunizations. Your caregiver may want you to have the following immunizations before leaving the hospital:  Tetanus, diphtheria, and pertussis (Tdap) or tetanus and diphtheria (Td) immunization. It is very important that you and your family (including grandparents) or others caring for your newborn are up-to-date with the Tdap or Td immunizations. The Tdap or Td immunization can help protect your newborn from getting ill.  Rubella immunization.  Varicella (chickenpox) immunization.  Influenza immunization. You should receive this annual immunization if you did not receive the immunization during your pregnancy. Document Released: 02/28/2007 Document Revised: 01/26/2012 Document Reviewed: 12/29/2011 [facility] Patient Information 2015 [place], [place]. This information is not intended to replace advice given to you by your health care provider. Make sure you discuss any questions you have with your health care provider.

## 2013-10-31 NOTE — Discharge Summary (Signed)
Obstetric Discharge Summary Reason for Admission: prodromal labor, postdate pregnancy Prenatal Procedures: none Intrapartum Procedures: spontaneous vaginal delivery Postpartum Procedures: none Complications-Operative and Postpartum: none Hemoglobin  Date Value Ref Range Status  10/30/2013 9.4* 12.0 - 15.0 g/dL Final     REPEATED TO VERIFY     DELTA CHECK NOTED     HCT  Date Value Ref Range Status  10/30/2013 28.4* 36.0 - 46.0 % Final    Physical Exam:  General: alert and cooperative Lochia: appropriate Uterine Fundus: firm Incision: intact DVT Evaluation: No evidence of DVT seen on physical exam.  Discharge Diagnoses: Post-date pregnancy  Discharge Information: Date: 10/31/2013 Activity: unrestricted Diet: routine Medications: PNV, Ibuprofen and Iron Condition: stable Instructions: refer to practice specific booklet Discharge to: home Follow-up Information   [facility]. (pt to call to schedule circ)    Specialty:  Obstetrics and Gynecology   Contact information:   [address] [place] [id number] [phone]      Newborn Data: Live born female  Birth Weight: 7 lb 1.9 oz (3229 g) APGAR: 8, 9  Home with mother.  Standard, Venus 10/31/2013, 9:06 AM

## 2014-03-18 ENCOUNTER — Encounter (HOSPITAL_COMMUNITY): Payer: Self-pay | Admitting: *Deleted

## 2015-05-16 IMAGING — US US OB TRANSVAGINAL
1 series · 14 of 28 positions shown · non-contrast
Comparison: None.

CLINICAL DATA: Lower abdominal and pelvic pain.

OBSTETRIC <14 WK US AND TRANSVAGINAL OB US
TECHNIQUE: Both transabdominal and transvaginal ultrasound
examinations were performed for complete evaluation of the
gestation as well as the maternal uterus, adnexal regions, and
pelvic cul-de-sac.  Transvaginal technique was performed to assess
early pregnancy.

[Series 1: us ob comp less 14 wks · 14 of 31 slices shown]
[im 2/31]
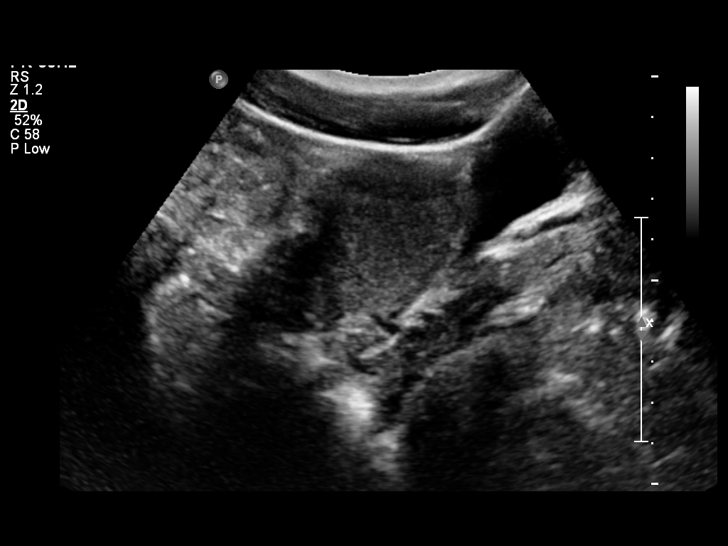
[im 4/31]
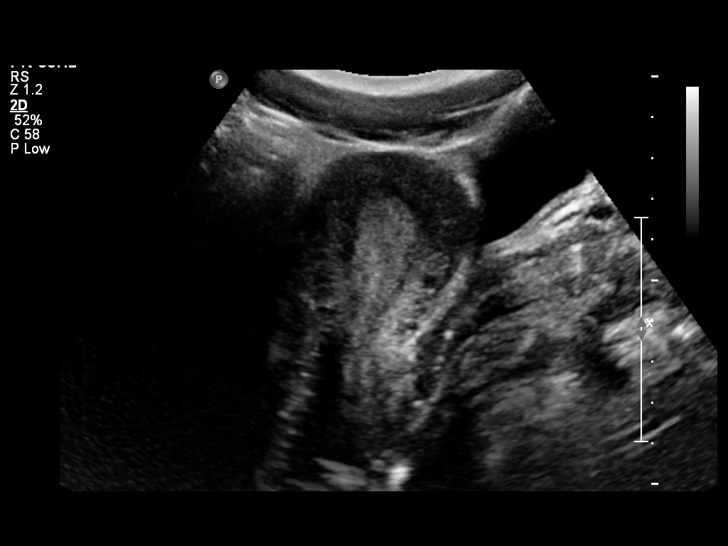
[im 6/31]
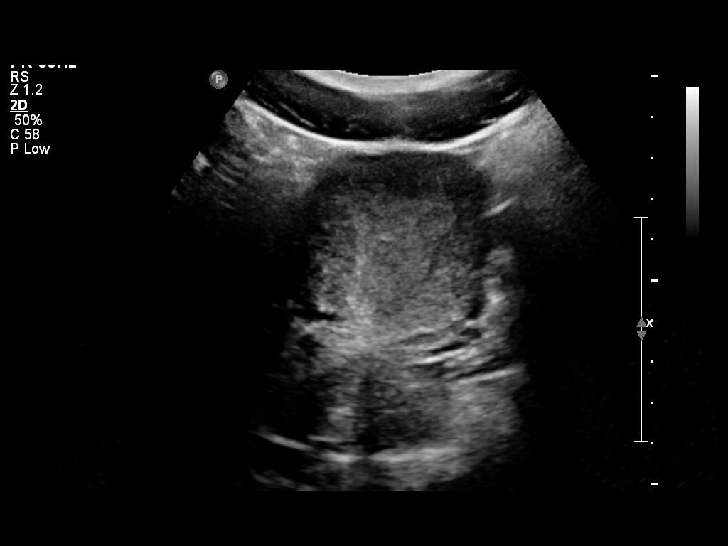
[im 8/31]
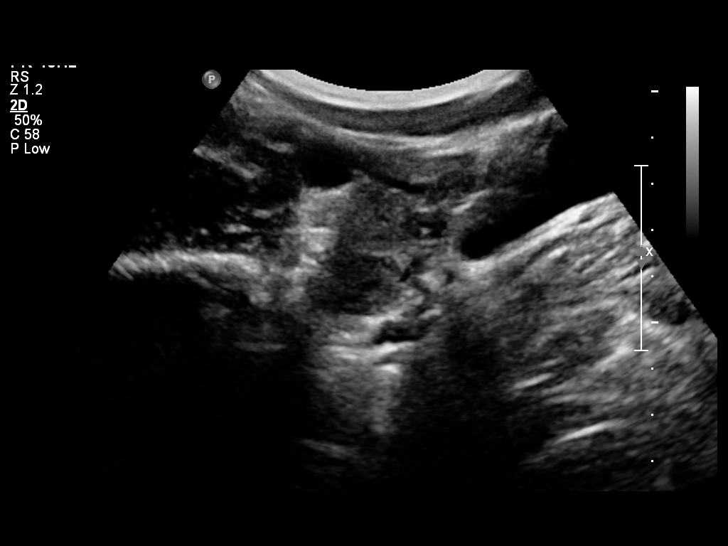
[im 11/31]
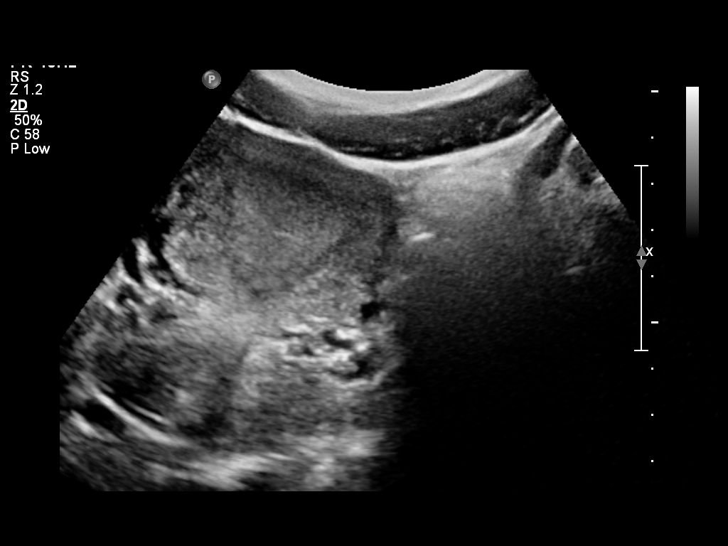
[im 13/31]
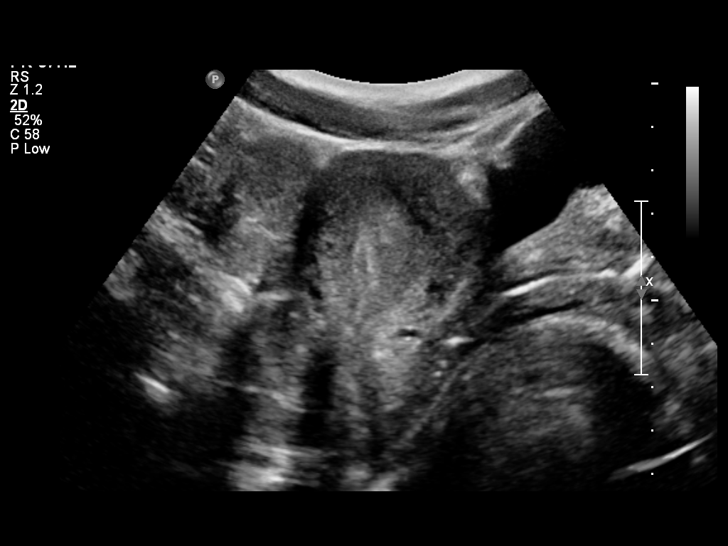
[im 15/31]
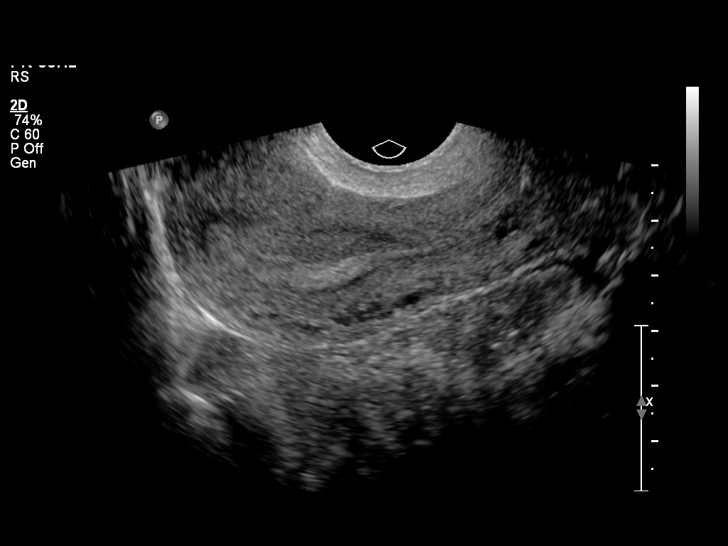
[im 17/31]
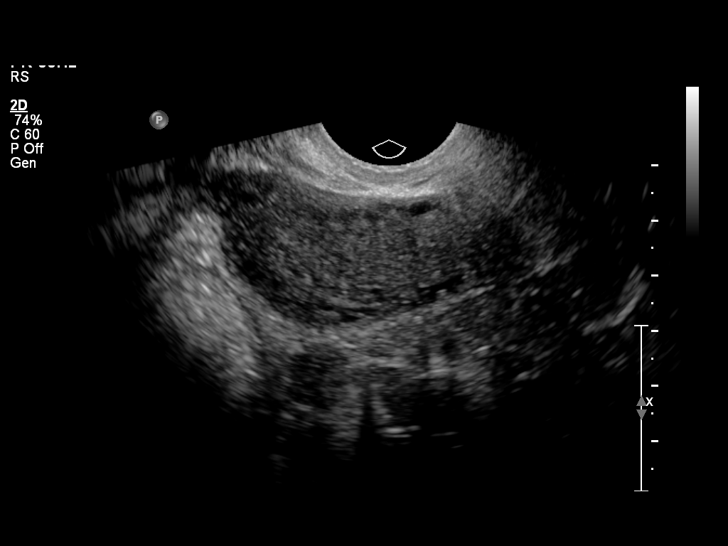
[im 19/31]
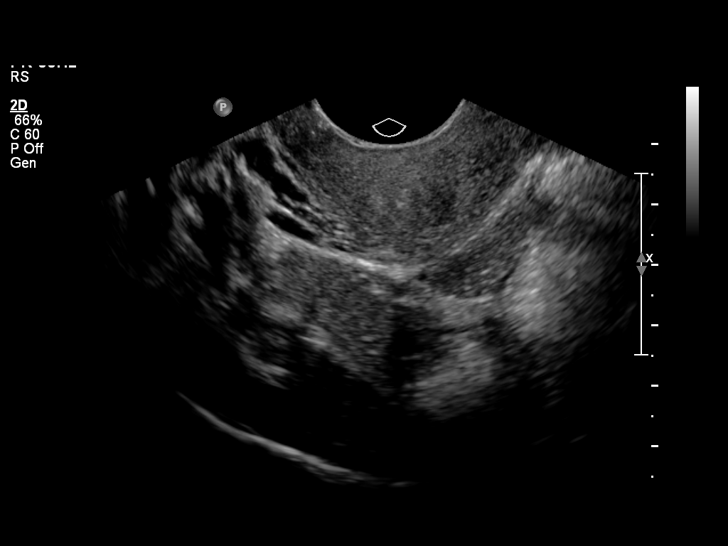
[im 22/31]
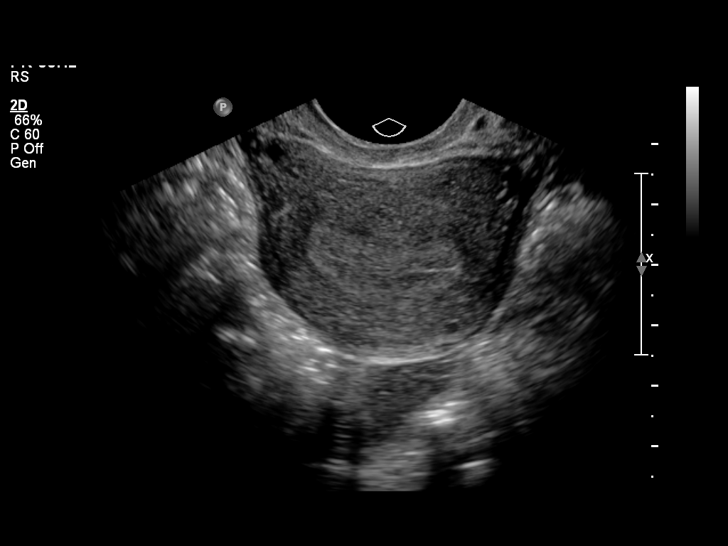
[im 24/31]
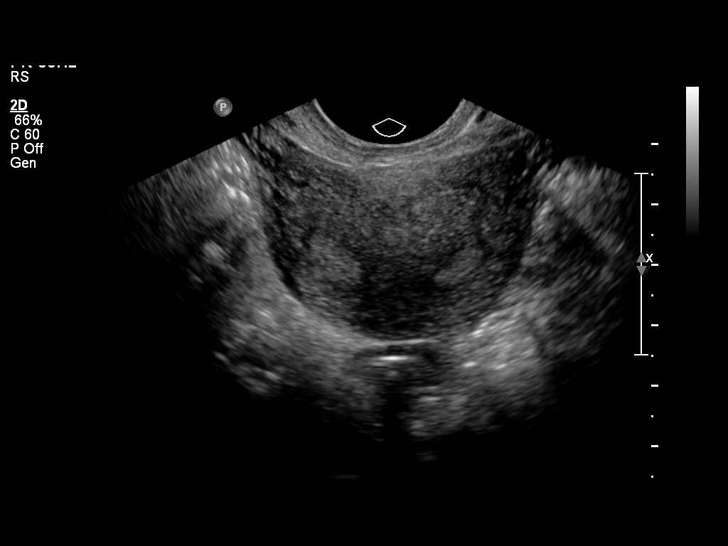
[im 26/31]
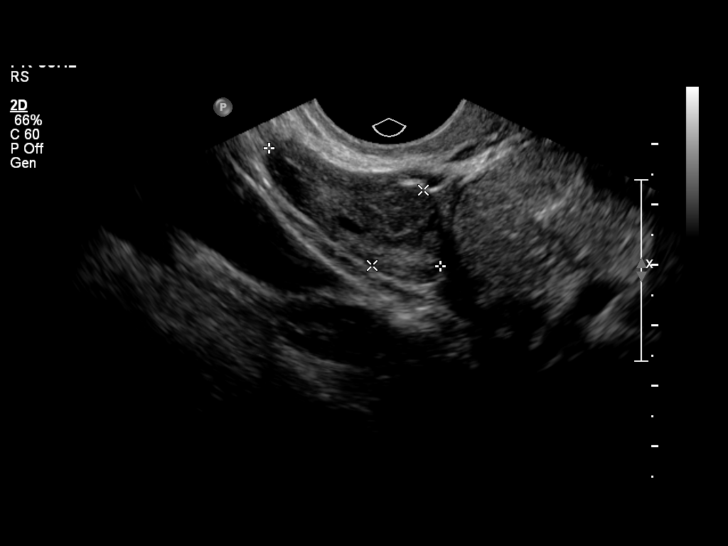
[im 28/31]
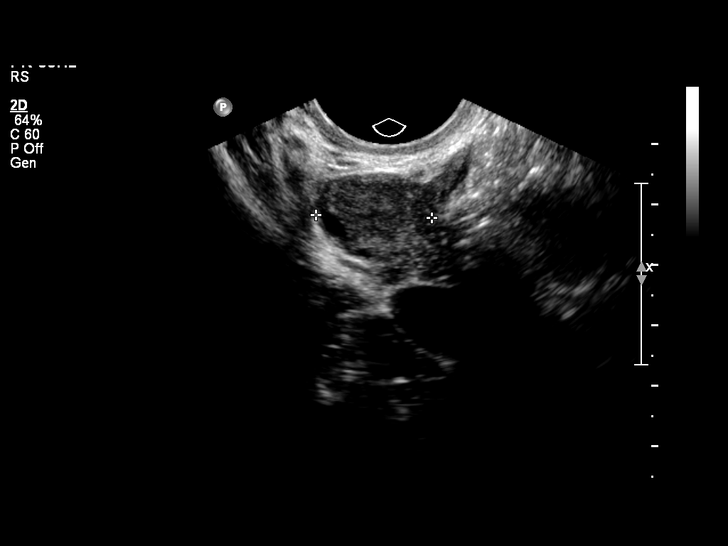
[im 31/31]
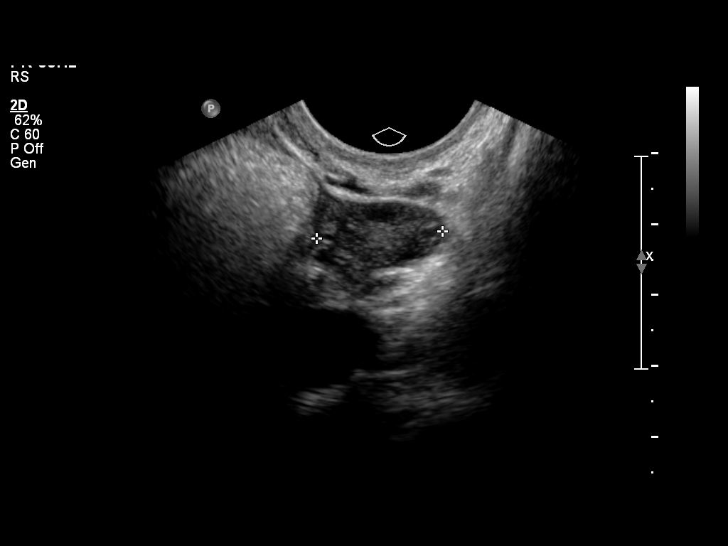

[14 of 28 positions shown; findings below may reference images not displayed]

Intrauterine gestational sac:  None seen.
Yolk sac: N/A
Embryo: N/A

Maternal uterus/adnexae:
The uterus is unremarkable in appearance; there is suggestion of an
arcuate uterus.  No subchorionic hemorrhage is noted.

The ovaries are within normal limits.  The right ovary measures
x 1.5 x 1.9 cm, while the left ovary measures 3.1 x 1.4 x 1.8 cm.
No suspicious adnexal masses are seen; there is no evidence for
ovarian torsion.

No free fluid is seen within the pelvic cul-de-sac.
IMPRESSION: No intrauterine gestational sac seen at this time; no evidence for
ectopic pregnancy.  No free fluid seen in the pelvic cul-de-sac.
It may be too early to characterize an intrauterine pregnancy.
Would correlate with the patient's pending quantitative beta HCG
level.

## 2015-09-08 IMAGING — US US OB COMP LESS 14 WK
1 series · 14 of 28 positions shown · non-contrast
Comparison: None.

CLINICAL DATA: Pelvic pain and cramping. 8 week 1 day gestational
age by LMP.

EXAM:
OBSTETRIC <14 WK ULTRASOUND
TECHNIQUE: Transabdominal ultrasound was performed for evaluation of the
gestation as well as the maternal uterus and adnexal regions.

[Series 1: us ob comp less 14 wks · 40 acquisitions, 14 frames shown]
[im 2/40]
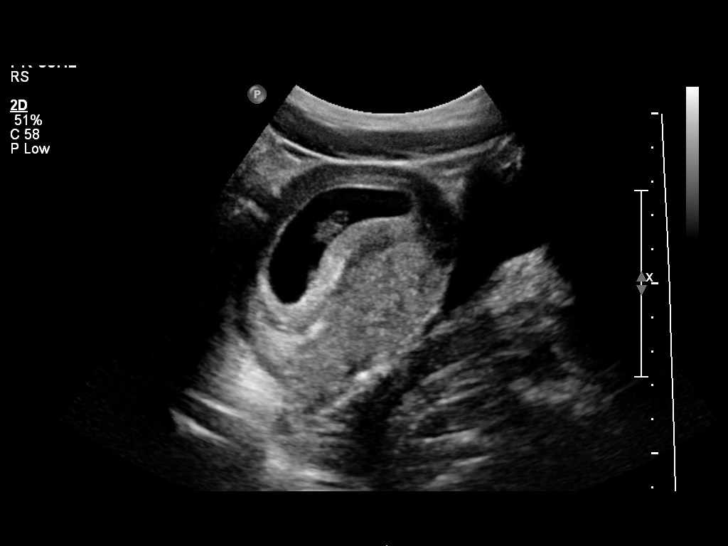
[im 5/40]
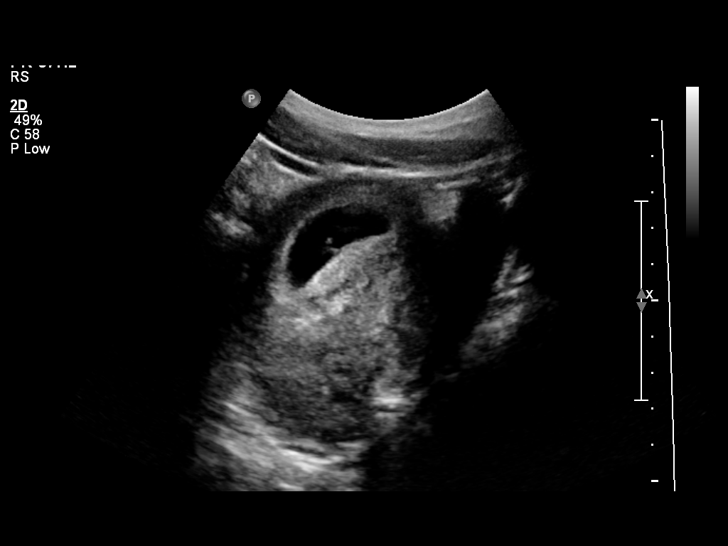
[im 8/40]
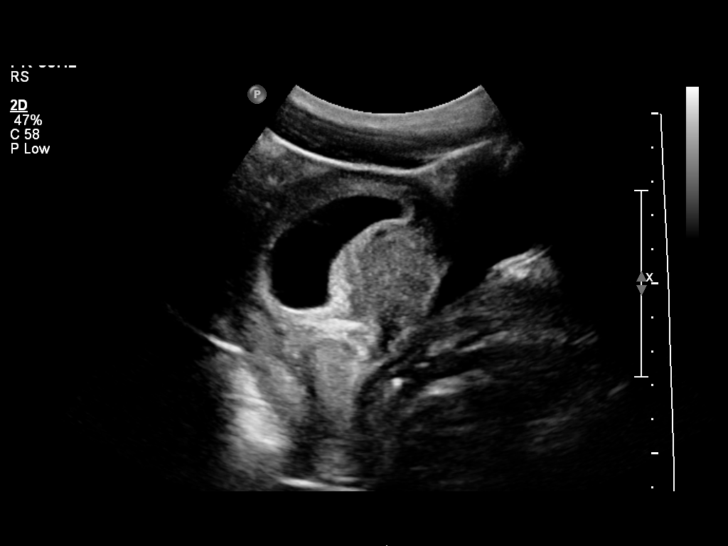
[im 11/40]
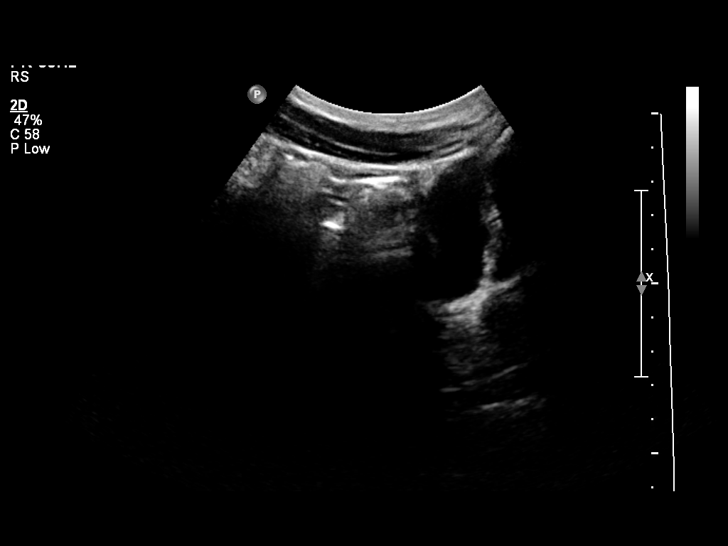
[im 14/40]
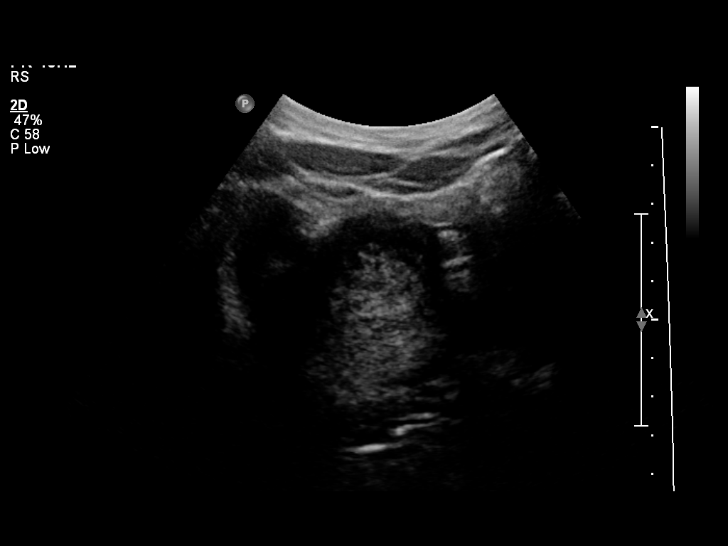
[im 16/40]
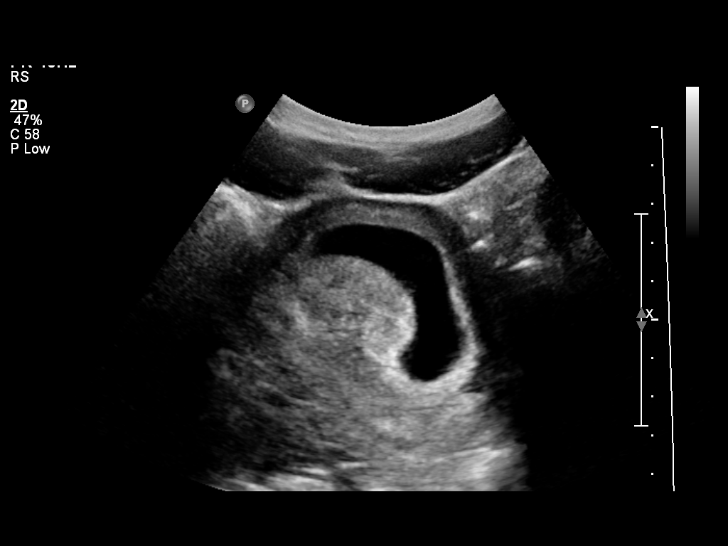
[im 19/40]
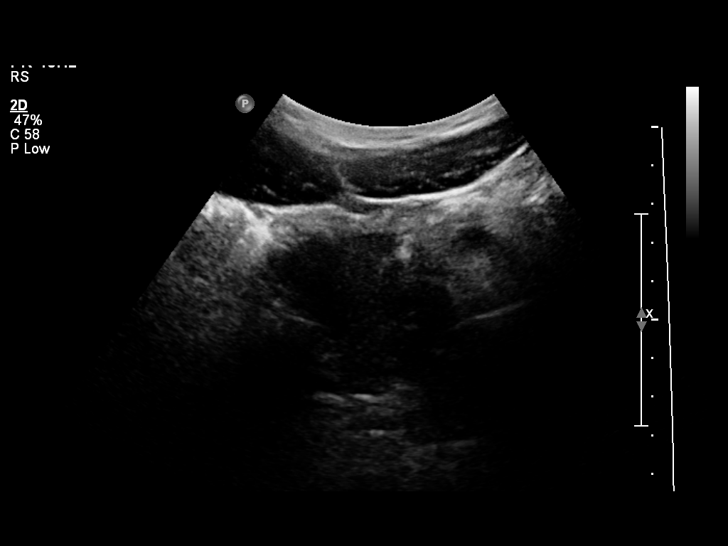
[im 22/40]
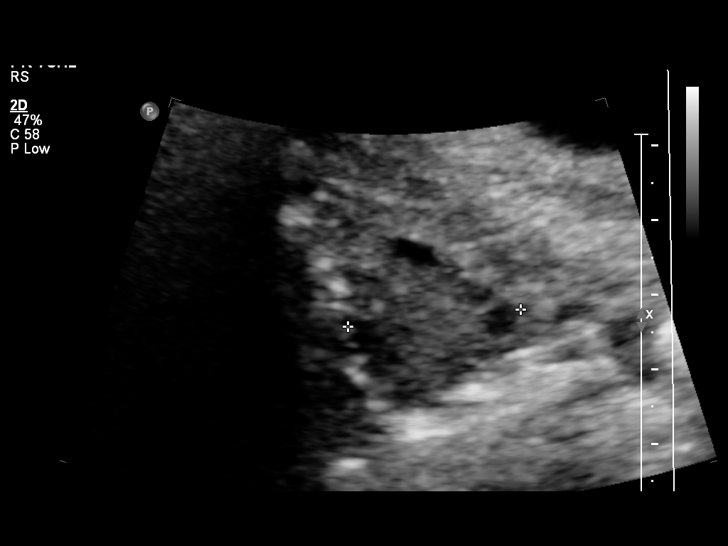
[im 25/40]
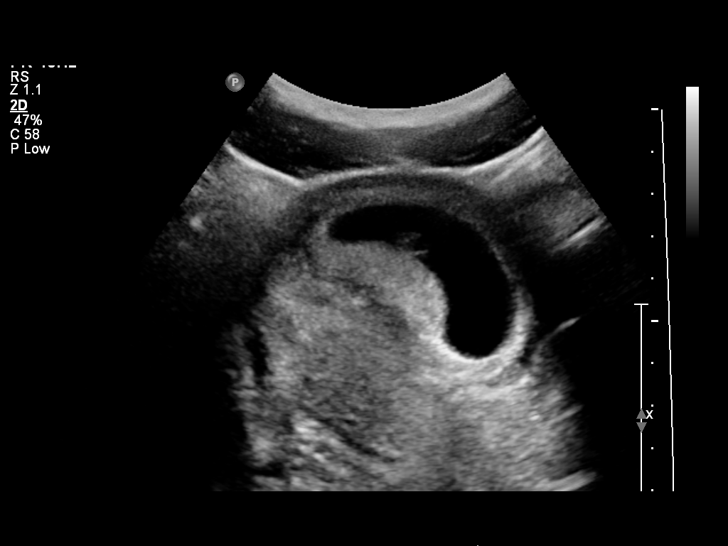
[im 28/40]
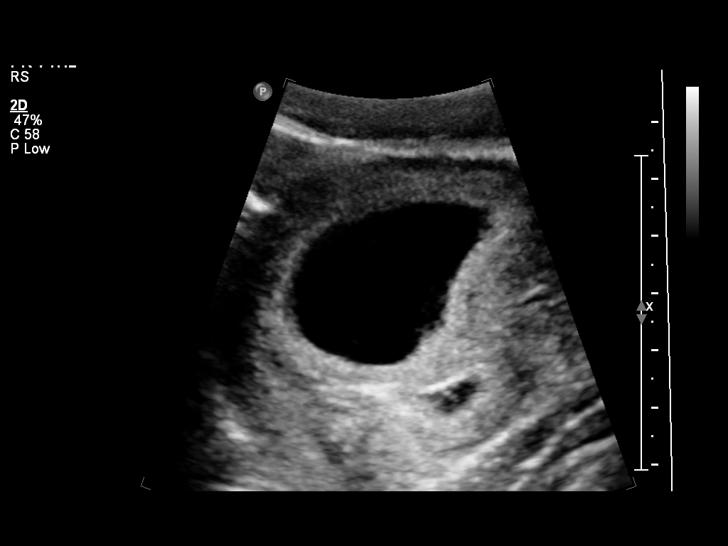
[im 31/40]
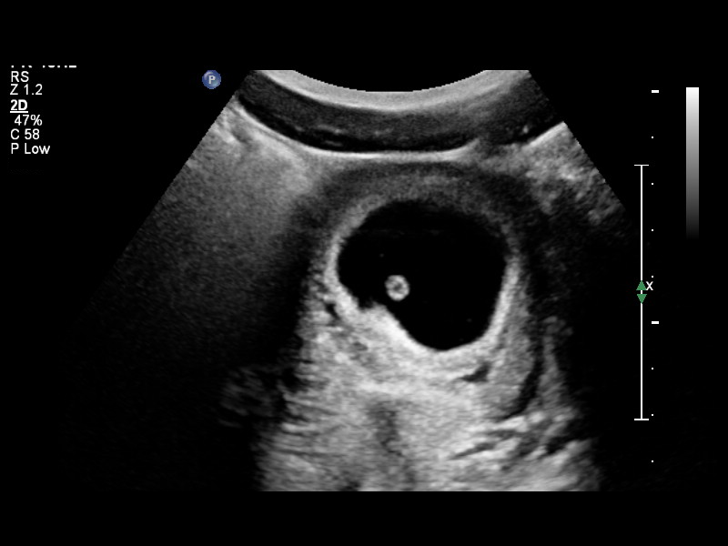
[im 34/40]
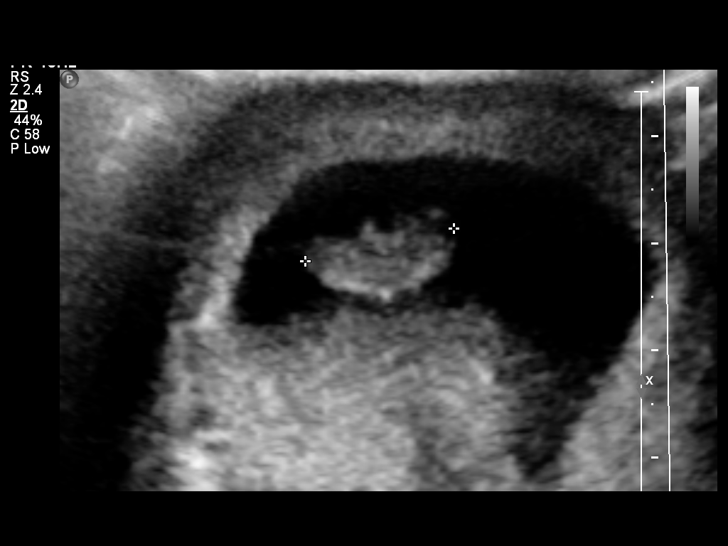
[im 37/40]
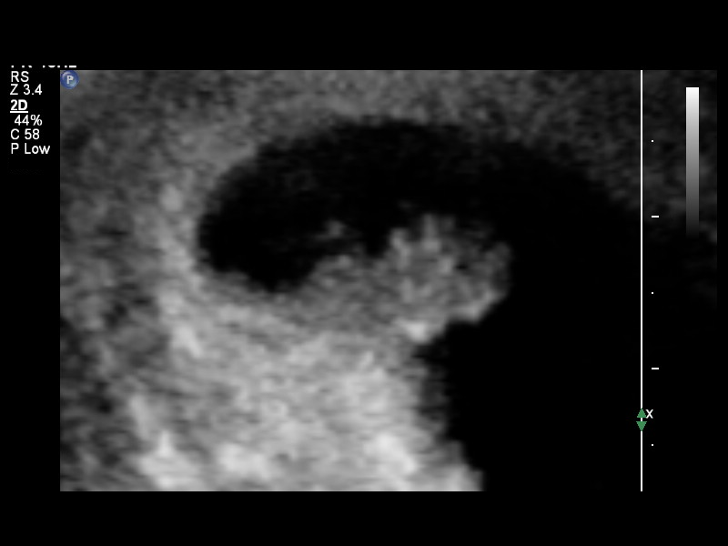
[im 40/40]
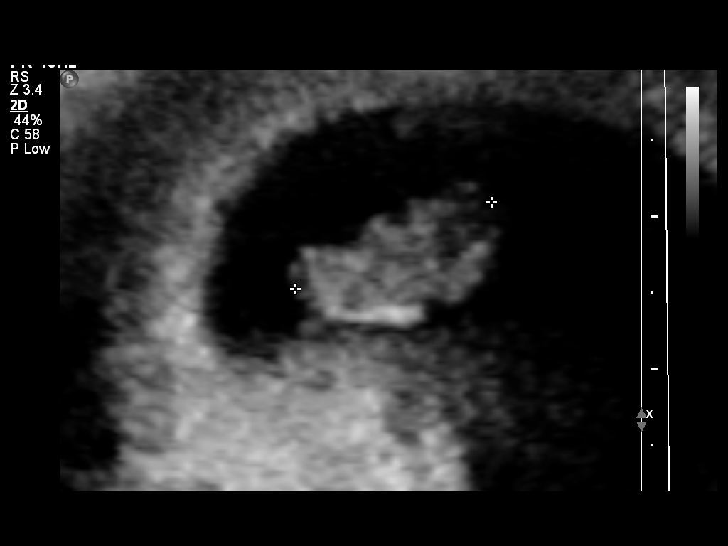

[14 of 28 positions shown; findings below may reference images not displayed]

FINDINGS: Intrauterine gestational sac: Visualized/normal in shape.

Yolk sac:  Visualized

Embryo:  Visualized

Cardiac Activity: Visualized

Heart Rate: 146 bpm

CRL:   14  mm   7 w 5 d                  US EDC: 11/02/13

Maternal uterus/adnexae: Small subchorionic hemorrhage noted in
lower uterine segment. Right ovary is normal in appearance. Left
ovary not directly visualized, however no adnexal mass or free fluid
identified.
IMPRESSION: Single living IUP measuring 7 weeks 5 days, which is concordant with
LMP.

Small subchorionic hemorrhage noted.
